# Patient Record
Sex: Male | Born: 1935
Health system: Southern US, Community
[De-identification: ages and names within clinical notes are randomized; demographics above are authoritative.]

## PROBLEM LIST (undated history)

## (undated) DIAGNOSIS — I714 Abdominal aortic aneurysm, without rupture, unspecified: Secondary | ICD-10-CM

## (undated) DIAGNOSIS — K219 Gastro-esophageal reflux disease without esophagitis: Secondary | ICD-10-CM

## (undated) DIAGNOSIS — I10 Essential (primary) hypertension: Secondary | ICD-10-CM

## (undated) DIAGNOSIS — R002 Palpitations: Secondary | ICD-10-CM

## (undated) DIAGNOSIS — N529 Male erectile dysfunction, unspecified: Secondary | ICD-10-CM

## (undated) DIAGNOSIS — E785 Hyperlipidemia, unspecified: Secondary | ICD-10-CM

## (undated) HISTORY — DX: Abdominal aortic aneurysm, without rupture: I71.4

## (undated) HISTORY — DX: Abdominal aortic aneurysm, without rupture, unspecified: I71.40

## (undated) HISTORY — DX: Hyperlipidemia, unspecified: E78.5

## (undated) HISTORY — PX: INGUINAL HERNIA REPAIR: SUR1180

## (undated) HISTORY — DX: Male erectile dysfunction, unspecified: N52.9

## (undated) HISTORY — DX: Essential (primary) hypertension: I10

## (undated) HISTORY — DX: Palpitations: R00.2

## (undated) HISTORY — PX: HEMORROIDECTOMY: SUR656

## (undated) HISTORY — DX: Gastro-esophageal reflux disease without esophagitis: K21.9

---

## 1967-12-14 HISTORY — PX: NEPHRECTOMY: SHX65

## 1967-12-14 HISTORY — PX: KIDNEY DONATION: SHX685

## 2011-12-30 DIAGNOSIS — H2510 Age-related nuclear cataract, unspecified eye: Secondary | ICD-10-CM | POA: Diagnosis not present

## 2011-12-30 DIAGNOSIS — H4011X0 Primary open-angle glaucoma, stage unspecified: Secondary | ICD-10-CM | POA: Diagnosis not present

## 2012-02-07 DIAGNOSIS — I10 Essential (primary) hypertension: Secondary | ICD-10-CM | POA: Diagnosis not present

## 2012-02-07 DIAGNOSIS — Z125 Encounter for screening for malignant neoplasm of prostate: Secondary | ICD-10-CM | POA: Diagnosis not present

## 2012-02-07 DIAGNOSIS — N529 Male erectile dysfunction, unspecified: Secondary | ICD-10-CM | POA: Diagnosis not present

## 2012-02-07 DIAGNOSIS — E7800 Pure hypercholesterolemia, unspecified: Secondary | ICD-10-CM | POA: Diagnosis not present

## 2012-02-07 DIAGNOSIS — K219 Gastro-esophageal reflux disease without esophagitis: Secondary | ICD-10-CM | POA: Diagnosis not present

## 2012-02-07 DIAGNOSIS — I714 Abdominal aortic aneurysm, without rupture: Secondary | ICD-10-CM | POA: Diagnosis not present

## 2012-02-07 DIAGNOSIS — Z79899 Other long term (current) drug therapy: Secondary | ICD-10-CM | POA: Diagnosis not present

## 2012-02-07 DIAGNOSIS — Z136 Encounter for screening for cardiovascular disorders: Secondary | ICD-10-CM | POA: Diagnosis not present

## 2012-02-11 DIAGNOSIS — R7309 Other abnormal glucose: Secondary | ICD-10-CM | POA: Diagnosis not present

## 2012-02-17 ENCOUNTER — Encounter: Payer: Self-pay | Admitting: Vascular Surgery

## 2012-02-18 ENCOUNTER — Ambulatory Visit (INDEPENDENT_AMBULATORY_CARE_PROVIDER_SITE_OTHER): Payer: Medicare Other | Admitting: Vascular Surgery

## 2012-02-18 ENCOUNTER — Encounter: Payer: Self-pay | Admitting: Vascular Surgery

## 2012-02-18 VITALS — BP 132/80 | HR 64 | Resp 18 | Ht 68.0 in | Wt 158.0 lb

## 2012-02-18 DIAGNOSIS — I714 Abdominal aortic aneurysm, without rupture, unspecified: Secondary | ICD-10-CM | POA: Insufficient documentation

## 2012-02-18 DIAGNOSIS — I724 Aneurysm of artery of lower extremity: Secondary | ICD-10-CM

## 2012-02-18 DIAGNOSIS — R0989 Other specified symptoms and signs involving the circulatory and respiratory systems: Secondary | ICD-10-CM

## 2012-02-18 NOTE — Progress Notes (Signed)
[facility] [place]  Referred by: Desmond Dike, MD [address] [place]. [place], [place] [address]  Reason for referral: AAA  History of Present Illness  The patient is a 77 y.o. (1935/07/18) male who presents with chief complaint: new AAA on screening.  Previous studies demonstrate an AAA, measuring 4.5 cm.  The patient does not have back or abdominal pain.  The patient does not history of embolic episodes from the AAA.  The patient's risk factors for AAA included: family (father had TAA), history of smoking, age, and male sex.  The patient does not smoke cigarettes anymore.  Past Medical History  Diagnosis Date  . Hyperlipidemia   . Hypertension   . GERD (gastroesophageal reflux disease)   . ED (erectile dysfunction)   . Palpitations   . AAA (abdominal aortic aneurysm)     Past Surgical History  Procedure Date  . Inguinal hernia repair     Right inguinal hernia  . Hemorroidectomy   . Nephrectomy 1969    Donor for his brother    History   Social History  . Marital Status: Unknown    Spouse Name: N/A    Number of Children: N/A  . Years of Education: N/A   Occupational History  . Not on file.   Social History Main Topics  . Smoking status: Former Smoker    Quit date: 02/17/1992  . Smokeless tobacco: Not on file  . Alcohol Use: Yes     Occasional use  . Drug Use: No  . Sexually Active:    Other Topics Concern  . Not on file   Social History Narrative  . No narrative on file    Family History  Problem Relation Age of Onset  . Cancer Brother     prostate cancer    Current Outpatient Prescriptions on File Prior to Visit  Medication Sig Dispense Refill  . amLODipine (NORVASC) 10 MG tablet Take 10 mg by mouth daily.      Marland Kitchen aspirin 81 MG tablet Take 81 mg by mouth daily.      . bisoprolol-hydrochlorothiazide (ZIAC) 5-6.25 MG per tablet Take 1 tablet by mouth daily.      . fish oil-omega-3 fatty acids 1000 MG capsule Take 1 g by mouth daily.        . ranitidine (ZANTAC) 150 MG tablet Take 150 mg by mouth 2 (two) times daily.      . simvastatin (ZOCOR) 20 MG tablet Take 20 mg by mouth at bedtime.      . vardenafil (LEVITRA) 20 MG tablet Take 20 mg by mouth daily as needed.      . vitamin E 400 UNIT capsule Take 400 Units by mouth daily.        No Known Allergies  REVIEW OF SYSTEMS:  (Positives checked otherwise negative)  CARDIOVASCULAR: [ ]  chest pain    [ ]  chest pressure    [ ]  palpitations   [ ]  orthopnea   [ ]  dyspnea on exert. [ ]  claudication    [ ]  rest pain     [ ]  DVT     [ ]  phlebitis  PULMONARY:    [ ]  productive cough [ ]  asthma  [ ]  wheezing  NEUROLOGIC:    [ ]  weakness    [ ]  paresthesias   [ ]  aphasia    [ ]  amaurosis    [ ]  dizziness  HEMATOLOGIC:    [ ]  bleeding problems  [ ]   clotting disorders  MUSCULOSKEL: [ ]  joint pain     [ ]  joint swelling  GASTROINTEST:  [ ]   blood in stool   [ ]   hematemesis  GENITOURINARY:   [ ]   dysuria    [ ]   hematuria  PSYCHIATRIC:   [ ]  history of major depression  INTEGUMENTARY: [ ]  rashes    [ ]  ulcers  CONSTITUTIONAL:  [ ]  fever     [ ]  chills  Physical Examination  Filed Vitals:   02/18/12 0858  BP: 132/80  Pulse: 64  Resp: 18  Height: 5\' 8"  (1.727 m)  Weight: 158 lb (71.668 kg)  SpO2: 99%   Body mass index is 24.02 kg/(m^2).  General: A&O x 3, WDWN  Head: [place]/AT  Ear/Nose/Throat: Hearing grossly intact, nares w/o erythema or drainage, oropharynx w/o Erythema/Exudate  Eyes: PERRLA, EOMI  Neck: Supple, no nuchal rigidity, no palpable LAD  Pulmonary: Sym exp, good air movt, CTAB, no rales, rhonchi, & wheezing  Cardiac: RRR, Nl S1, S2, no Murmurs, rubs or gallops  Vascular: Vessel Right Left  Radial Palpable Palpable  Brachial Palpable Palpable  Carotid Palpable, with bruit Palpable, without bruit  Aorta Non-palpable N/A  Femoral Palpable Palpable  Popliteal Prominently palpable Prominently palpable  PT Palpable Palpable  DP Palpable Palpable    Gastrointestinal: soft, NTND, -G/R, - HSM, - masses, - CVAT B  Musculoskeletal: M/S 5/5 throughout , Extremities without ischemic changes   Neurologic: CN 2-12 intact , Pain and light touch intact in extremities , Motor exam as listed above  Psychiatric: Judgment intact, Mood & affect appropriate for pt's clinical situation  Dermatologic: See M/S exam for extremity exam, no rashes otherwise noted  Lymph : No Cervical, Axillary, or Inguinal lymphadenopathy   Outside Studies/Documentation 5 pages of outside documents were reviewed including: outside ultrasound report.  Medical Decision Making  The patient is a 77 y.o. male who presents with: small asx AAA, possible B popliteal aneurysms, and R carotid bruit   Based on this patient's exam and diagnostic studies, he needs: CTA chest/abd/pelvis, Duplex B popliteal, and B carotid duplex.  The threshold for repair is AAA size > 5.5 cm, growth > 1 cm/yr, and symptomatic status.  The patient will follow up in 4 weeks with the above studies.  I emphasized the importance of maximal medical management including strict control of blood pressure, blood glucose, and lipid levels, antiplatelet agents, obtaining regular exercise, and cessation of smoking.    Thank you for allowing [place] to participate in this patient's care.  Leonides Sake, MD [facility] Office: [phone] Pager: [phone]  02/18/2012, 9:19 AM

## 2012-03-23 ENCOUNTER — Encounter: Payer: Self-pay | Admitting: Vascular Surgery

## 2012-03-23 DIAGNOSIS — I714 Abdominal aortic aneurysm, without rupture: Secondary | ICD-10-CM | POA: Diagnosis not present

## 2012-03-24 ENCOUNTER — Ambulatory Visit (INDEPENDENT_AMBULATORY_CARE_PROVIDER_SITE_OTHER): Payer: Medicare Other | Admitting: Vascular Surgery

## 2012-03-24 ENCOUNTER — Ambulatory Visit
Admission: RE | Admit: 2012-03-24 | Discharge: 2012-03-24 | Disposition: A | Discharge status: Home / Self Care | Payer: Medicare Other | Source: Ambulatory Visit | Attending: Vascular Surgery | Admitting: Vascular Surgery

## 2012-03-24 ENCOUNTER — Encounter (INDEPENDENT_AMBULATORY_CARE_PROVIDER_SITE_OTHER): Payer: Medicare Other | Admitting: *Deleted

## 2012-03-24 ENCOUNTER — Ambulatory Visit: Payer: Medicare Other | Admitting: Vascular Surgery

## 2012-03-24 ENCOUNTER — Encounter: Payer: Self-pay | Admitting: Vascular Surgery

## 2012-03-24 ENCOUNTER — Other Ambulatory Visit (INDEPENDENT_AMBULATORY_CARE_PROVIDER_SITE_OTHER): Payer: Medicare Other | Admitting: *Deleted

## 2012-03-24 ENCOUNTER — Other Ambulatory Visit: Payer: Medicare Other

## 2012-03-24 VITALS — BP 144/69 | HR 57 | Temp 97.9°F | Ht 68.0 in | Wt 158.0 lb

## 2012-03-24 DIAGNOSIS — I70209 Unspecified atherosclerosis of native arteries of extremities, unspecified extremity: Secondary | ICD-10-CM | POA: Diagnosis not present

## 2012-03-24 DIAGNOSIS — I714 Abdominal aortic aneurysm, without rupture, unspecified: Secondary | ICD-10-CM

## 2012-03-24 DIAGNOSIS — K5730 Diverticulosis of large intestine without perforation or abscess without bleeding: Secondary | ICD-10-CM | POA: Diagnosis not present

## 2012-03-24 DIAGNOSIS — R0989 Other specified symptoms and signs involving the circulatory and respiratory systems: Secondary | ICD-10-CM

## 2012-03-24 DIAGNOSIS — I6529 Occlusion and stenosis of unspecified carotid artery: Secondary | ICD-10-CM | POA: Diagnosis not present

## 2012-03-24 DIAGNOSIS — I724 Aneurysm of artery of lower extremity: Secondary | ICD-10-CM

## 2012-03-24 MED ORDER — IOHEXOL 350 MG/ML SOLN
80.0000 mL | Freq: Once | INTRAVENOUS | Status: AC | PRN
  Administered 2012-03-24: 80 mL via INTRAVENOUS

## 2012-03-24 NOTE — Procedures (Unsigned)
LOWER EXTREMITY ARTERIAL DUPLEX  INDICATION:  4.5 abdominal aortic aneurysm  HISTORY: Diabetes:  No Cardiac:  No Hypertension:  Yes Smoking:  Previous Previous Surgery:  Left nephrectomy, donation to brother  SINGLE LEVEL ARTERIAL EXAM                         RIGHT                LEFT Brachial: Anterior tibial: Posterior tibial: Peroneal: Ankle/Brachial Index:   1.10                 1.16  LOWER EXTREMITY ARTERIAL DUPLEX EXAM  DUPLEX:  Mild heterogeneous plaque throughout both lower extremities with biphasic waveforms.  IMPRESSION: 1. No evidence of significant lower extremity arterial disease. 2. No popliteal aneurysm visualized. 3. Ankle brachial indices within normal range, see attached sheet.  ___________________________________________ Fransisco Hertz, MD  SS/MEDQ  D:  03/24/2012  T:  03/24/2012  Job:  [id number]

## 2012-03-24 NOTE — Progress Notes (Signed)
[facility]  Established Abdominal Aortic Aneurysm  History of Present Illness  The patient is a 77 y.o. (08/13/1935) male who presents with chief complaint: follow up for AAA.  The pt returns for a multitude of studies to fully evaluate his AAA and arterial system.  The patient does not have back or abdominal pain.  The patient is a former smoker who quit 20 years ago  The patient's PMH, PSH, SH, FamHx, Med, Allergies and ROS are unchanged from 02/18/12.  Physical Examination  Filed Vitals:   03/24/12 1418  BP: 144/69  Pulse: 57  Temp: 97.9 F (36.6 C)  TempSrc: Oral  Height: 5\' 8"  (1.727 m)  Weight: 158 lb (71.668 kg)   Body mass index is 24.02 kg/(m^2).  General: A&O x 3, WDWN  Pulmonary: Sym exp, good air movt, CTAB, no rales, rhonchi, & wheezing  Cardiac: RRR, Nl S1, S2, no Murmurs, rubs or gallops  Vascular: Vessel Right Left  Radial Palpable Palpable  Brachial Palpable Palpable  Carotid Palpable, without bruit Palpable, without bruit  Aorta Non-palpable N/A  Femoral Palpable Palpable  Popliteal Non-palpable Non-palpable  PT Palpable Palpable  DP Palpable Palpable   Gastrointestinal: soft, NTND, -G/R, - HSM, - masses, - CVAT B  Musculoskeletal: M/S 5/5 throughout , Extremities without ischemic changes   Neurologic: Pain and light touch intact in extremities , Motor exam as listed above  CTA Chest/Abd/Pelvis (Date: 03/24/12) 1. Negative for thoracic aortic aneurysm.  2. Moderate centrilobular emphysematous change. 3. Fusiform infrarenal abdominal aortic aneurysm measures 4.4 x 4.2 cm in greatest true luminal diameter. The aneurysm originates approximately 3.4 cm caudal to the solitary remaining right-sided renal artery and extends to the level of the aortic bifurcation.  The aneurysm is largely free of mural thrombus. The IMA remains  patent.  4. At least 50% luminal narrowing of the origin of the right common iliac artery. The left  common iliac arteries noted be tortuous but free of hemodynamically significant narrowing.  5. Colonic diverticulosis without evidence of diverticulitis.  6. Post left-sided nephrectomy compatible with provided history of renal donor.  7. Geographic areas of hyperenhancement within the dome of the right lobe of the liver are favored to be perfusional in etiology. Continued attention on follow-up is recommended.  Based on my interpretation of this patient's CTA Chest/abd/pelvis: he has no TAA, he has an AAA that is roughly 4.2 cm in diameter.  There is remarkably limited thrombus in this aneurysm.  His anatomy appears compatible with an EVAR.  Non-Invasive Vascular Imaging  BLE ABI (Date: 03/24/12)  RLE: 1.1, PT and DP: triphasic  LLE: 1.16, PT and DP: triphasic  CAROTID DUPLEX (Date: 03/24/12):   R ICA stenosis: 1-39%  R VA: patent and antegrade  L ICA stenosis: 1-39%  L VA: patent and antegrade  BLE Arterial Duplex (Date: 03/24/12)  Widely patent BLE arterial system with no evidence of popliteal aneurysm  Medical Decision Making  The patient is a 77 y.o. male who presents with: small AAA.   The pt has no evidence of B ICA stenosis despite bruit in R neck  Additionally he has no evidence of popliteal aneurysm despite prominent pulses  Based on this patient's exam and diagnostic studies, he needs continued annual surveillance with AAA duplex.  The threshold for repair is AAA size > 5.5 cm, growth > 1 cm/yr, and symptomatic status.  I emphasized the importance of maximal medical management including strict control of blood pressure, blood  glucose, and lipid levels, antiplatelet agents, obtaining regular exercise, and cessation of smoking.    Thank you for allowing [place] to participate in this patient's care.  Leonides Sake, MD [facility] Office: [phone] Pager: [phone]  03/24/2012, 5:01 PM

## 2012-03-27 NOTE — Procedures (Unsigned)
CAROTID DUPLEX EXAM  INDICATION:  Right carotid bruit, carotid artery disease  HISTORY: Diabetes:  No Cardiac:  No Hypertension:  Yes Smoking:  Previous Previous Surgery: CV History:  Asymptomatic Amaurosis Fugax No, Paresthesias No, Hemiparesis No                                      RIGHT             LEFT Brachial systolic pressure:         142               141 Brachial Doppler waveforms:         Triphasic         Triphasic Vertebral direction of flow:        Antegrade         Antegrade DUPLEX VELOCITIES (cm/sec) CCA peak systolic                   97                100 ECA peak systolic                   140               75 ICA peak systolic                   91 (distal)       70 ICA end diastolic                   28                24 PLAQUE MORPHOLOGY:                  Mixed             Mixed PLAQUE AMOUNT:                      Small to moderate, irregular        Small, irregular PLAQUE LOCATION:                    Bifurcation, ICA and ECA            Bifurcation, ICA  IMPRESSION:  1%-39% internal carotid artery stenosis bilaterally. Vertebral artery flow antegrade bilaterally.  ___________________________________________ Fransisco Hertz, MD  SS/MEDQ  D:  03/24/2012  T:  03/24/2012  Job:  [id number]

## 2012-05-04 DIAGNOSIS — H4011X0 Primary open-angle glaucoma, stage unspecified: Secondary | ICD-10-CM | POA: Diagnosis not present

## 2012-05-04 DIAGNOSIS — H2510 Age-related nuclear cataract, unspecified eye: Secondary | ICD-10-CM | POA: Diagnosis not present

## 2012-05-19 DIAGNOSIS — S43499A Other sprain of unspecified shoulder joint, initial encounter: Secondary | ICD-10-CM | POA: Diagnosis not present

## 2012-05-19 DIAGNOSIS — M25519 Pain in unspecified shoulder: Secondary | ICD-10-CM | POA: Diagnosis not present

## 2012-05-19 DIAGNOSIS — S46819A Strain of other muscles, fascia and tendons at shoulder and upper arm level, unspecified arm, initial encounter: Secondary | ICD-10-CM | POA: Diagnosis not present

## 2012-05-24 DIAGNOSIS — M25519 Pain in unspecified shoulder: Secondary | ICD-10-CM | POA: Diagnosis not present

## 2012-05-24 DIAGNOSIS — S43499A Other sprain of unspecified shoulder joint, initial encounter: Secondary | ICD-10-CM | POA: Diagnosis not present

## 2012-05-24 DIAGNOSIS — M6281 Muscle weakness (generalized): Secondary | ICD-10-CM | POA: Diagnosis not present

## 2012-05-29 DIAGNOSIS — M6281 Muscle weakness (generalized): Secondary | ICD-10-CM | POA: Diagnosis not present

## 2012-05-29 DIAGNOSIS — M25519 Pain in unspecified shoulder: Secondary | ICD-10-CM | POA: Diagnosis not present

## 2012-05-29 DIAGNOSIS — S43499A Other sprain of unspecified shoulder joint, initial encounter: Secondary | ICD-10-CM | POA: Diagnosis not present

## 2012-05-29 DIAGNOSIS — S46819A Strain of other muscles, fascia and tendons at shoulder and upper arm level, unspecified arm, initial encounter: Secondary | ICD-10-CM | POA: Diagnosis not present

## 2012-06-01 DIAGNOSIS — S46819A Strain of other muscles, fascia and tendons at shoulder and upper arm level, unspecified arm, initial encounter: Secondary | ICD-10-CM | POA: Diagnosis not present

## 2012-06-01 DIAGNOSIS — M25519 Pain in unspecified shoulder: Secondary | ICD-10-CM | POA: Diagnosis not present

## 2012-06-01 DIAGNOSIS — M6281 Muscle weakness (generalized): Secondary | ICD-10-CM | POA: Diagnosis not present

## 2012-06-05 DIAGNOSIS — M6281 Muscle weakness (generalized): Secondary | ICD-10-CM | POA: Diagnosis not present

## 2012-06-05 DIAGNOSIS — S43499A Other sprain of unspecified shoulder joint, initial encounter: Secondary | ICD-10-CM | POA: Diagnosis not present

## 2012-06-05 DIAGNOSIS — M25519 Pain in unspecified shoulder: Secondary | ICD-10-CM | POA: Diagnosis not present

## 2012-06-07 DIAGNOSIS — M25519 Pain in unspecified shoulder: Secondary | ICD-10-CM | POA: Diagnosis not present

## 2012-06-07 DIAGNOSIS — S46819A Strain of other muscles, fascia and tendons at shoulder and upper arm level, unspecified arm, initial encounter: Secondary | ICD-10-CM | POA: Diagnosis not present

## 2012-06-07 DIAGNOSIS — M6281 Muscle weakness (generalized): Secondary | ICD-10-CM | POA: Diagnosis not present

## 2012-06-07 DIAGNOSIS — S43499A Other sprain of unspecified shoulder joint, initial encounter: Secondary | ICD-10-CM | POA: Diagnosis not present

## 2012-06-12 DIAGNOSIS — M6281 Muscle weakness (generalized): Secondary | ICD-10-CM | POA: Diagnosis not present

## 2012-06-12 DIAGNOSIS — M25519 Pain in unspecified shoulder: Secondary | ICD-10-CM | POA: Diagnosis not present

## 2012-06-12 DIAGNOSIS — S46819A Strain of other muscles, fascia and tendons at shoulder and upper arm level, unspecified arm, initial encounter: Secondary | ICD-10-CM | POA: Diagnosis not present

## 2012-06-12 DIAGNOSIS — S43499A Other sprain of unspecified shoulder joint, initial encounter: Secondary | ICD-10-CM | POA: Diagnosis not present

## 2012-06-14 DIAGNOSIS — M6281 Muscle weakness (generalized): Secondary | ICD-10-CM | POA: Diagnosis not present

## 2012-06-14 DIAGNOSIS — S43499A Other sprain of unspecified shoulder joint, initial encounter: Secondary | ICD-10-CM | POA: Diagnosis not present

## 2012-06-14 DIAGNOSIS — M25519 Pain in unspecified shoulder: Secondary | ICD-10-CM | POA: Diagnosis not present

## 2012-06-19 DIAGNOSIS — S43499A Other sprain of unspecified shoulder joint, initial encounter: Secondary | ICD-10-CM | POA: Diagnosis not present

## 2012-06-19 DIAGNOSIS — M6281 Muscle weakness (generalized): Secondary | ICD-10-CM | POA: Diagnosis not present

## 2012-06-19 DIAGNOSIS — M25519 Pain in unspecified shoulder: Secondary | ICD-10-CM | POA: Diagnosis not present

## 2012-06-26 DIAGNOSIS — S46819A Strain of other muscles, fascia and tendons at shoulder and upper arm level, unspecified arm, initial encounter: Secondary | ICD-10-CM | POA: Diagnosis not present

## 2012-06-26 DIAGNOSIS — M6281 Muscle weakness (generalized): Secondary | ICD-10-CM | POA: Diagnosis not present

## 2012-06-26 DIAGNOSIS — M25519 Pain in unspecified shoulder: Secondary | ICD-10-CM | POA: Diagnosis not present

## 2012-06-28 DIAGNOSIS — S46819A Strain of other muscles, fascia and tendons at shoulder and upper arm level, unspecified arm, initial encounter: Secondary | ICD-10-CM | POA: Diagnosis not present

## 2012-06-28 DIAGNOSIS — M25519 Pain in unspecified shoulder: Secondary | ICD-10-CM | POA: Diagnosis not present

## 2012-06-28 DIAGNOSIS — M6281 Muscle weakness (generalized): Secondary | ICD-10-CM | POA: Diagnosis not present

## 2012-07-03 DIAGNOSIS — M25519 Pain in unspecified shoulder: Secondary | ICD-10-CM | POA: Diagnosis not present

## 2012-07-03 DIAGNOSIS — S46819A Strain of other muscles, fascia and tendons at shoulder and upper arm level, unspecified arm, initial encounter: Secondary | ICD-10-CM | POA: Diagnosis not present

## 2012-07-03 DIAGNOSIS — S43499A Other sprain of unspecified shoulder joint, initial encounter: Secondary | ICD-10-CM | POA: Diagnosis not present

## 2012-07-03 DIAGNOSIS — M6281 Muscle weakness (generalized): Secondary | ICD-10-CM | POA: Diagnosis not present

## 2012-07-07 DIAGNOSIS — M25519 Pain in unspecified shoulder: Secondary | ICD-10-CM | POA: Diagnosis not present

## 2012-07-10 DIAGNOSIS — M6281 Muscle weakness (generalized): Secondary | ICD-10-CM | POA: Diagnosis not present

## 2012-07-10 DIAGNOSIS — S43499A Other sprain of unspecified shoulder joint, initial encounter: Secondary | ICD-10-CM | POA: Diagnosis not present

## 2012-07-10 DIAGNOSIS — M25519 Pain in unspecified shoulder: Secondary | ICD-10-CM | POA: Diagnosis not present

## 2012-07-17 DIAGNOSIS — S43499A Other sprain of unspecified shoulder joint, initial encounter: Secondary | ICD-10-CM | POA: Diagnosis not present

## 2012-07-17 DIAGNOSIS — S46819A Strain of other muscles, fascia and tendons at shoulder and upper arm level, unspecified arm, initial encounter: Secondary | ICD-10-CM | POA: Diagnosis not present

## 2012-07-17 DIAGNOSIS — M25519 Pain in unspecified shoulder: Secondary | ICD-10-CM | POA: Diagnosis not present

## 2012-07-17 DIAGNOSIS — M6281 Muscle weakness (generalized): Secondary | ICD-10-CM | POA: Diagnosis not present

## 2012-07-19 DIAGNOSIS — S46819A Strain of other muscles, fascia and tendons at shoulder and upper arm level, unspecified arm, initial encounter: Secondary | ICD-10-CM | POA: Diagnosis not present

## 2012-07-19 DIAGNOSIS — M25519 Pain in unspecified shoulder: Secondary | ICD-10-CM | POA: Diagnosis not present

## 2012-07-19 DIAGNOSIS — M6281 Muscle weakness (generalized): Secondary | ICD-10-CM | POA: Diagnosis not present

## 2012-07-24 DIAGNOSIS — M6281 Muscle weakness (generalized): Secondary | ICD-10-CM | POA: Diagnosis not present

## 2012-07-24 DIAGNOSIS — M25519 Pain in unspecified shoulder: Secondary | ICD-10-CM | POA: Diagnosis not present

## 2012-07-24 DIAGNOSIS — S43499A Other sprain of unspecified shoulder joint, initial encounter: Secondary | ICD-10-CM | POA: Diagnosis not present

## 2012-07-24 DIAGNOSIS — S46819A Strain of other muscles, fascia and tendons at shoulder and upper arm level, unspecified arm, initial encounter: Secondary | ICD-10-CM | POA: Diagnosis not present

## 2012-08-23 DIAGNOSIS — Z79899 Other long term (current) drug therapy: Secondary | ICD-10-CM | POA: Diagnosis not present

## 2012-08-23 DIAGNOSIS — E7800 Pure hypercholesterolemia, unspecified: Secondary | ICD-10-CM | POA: Diagnosis not present

## 2012-08-23 DIAGNOSIS — Z23 Encounter for immunization: Secondary | ICD-10-CM | POA: Diagnosis not present

## 2012-09-28 ENCOUNTER — Encounter: Payer: Self-pay | Admitting: Neurosurgery

## 2012-09-28 DIAGNOSIS — J069 Acute upper respiratory infection, unspecified: Secondary | ICD-10-CM | POA: Diagnosis not present

## 2012-09-29 ENCOUNTER — Ambulatory Visit (INDEPENDENT_AMBULATORY_CARE_PROVIDER_SITE_OTHER): Payer: Medicare Other | Admitting: Neurosurgery

## 2012-09-29 ENCOUNTER — Encounter: Payer: Self-pay | Admitting: Neurosurgery

## 2012-09-29 ENCOUNTER — Encounter (INDEPENDENT_AMBULATORY_CARE_PROVIDER_SITE_OTHER): Payer: Medicare Other | Admitting: *Deleted

## 2012-09-29 ENCOUNTER — Ambulatory Visit: Payer: Medicare Other | Admitting: Vascular Surgery

## 2012-09-29 VITALS — BP 130/68 | HR 63 | Resp 16 | Ht 68.0 in | Wt 152.5 lb

## 2012-09-29 DIAGNOSIS — I714 Abdominal aortic aneurysm, without rupture: Secondary | ICD-10-CM

## 2012-09-29 NOTE — Progress Notes (Signed)
[facility] AAA/PAD/PVD Office Note  CC: AAA surveillance Referring Physician: Imogene Burn  History of Present Illness:  77 year old male patient of Dr. Imogene Burn followed for small known AAA. The patient denies any unusual back or abdominal pain. Dr. Imogene Burn evaluated the patient thoroughly for any PVD in April 2013.   Past Medical History  Diagnosis Date  . Hyperlipidemia   . Hypertension   . GERD (gastroesophageal reflux disease)   . ED (erectile dysfunction)   . Palpitations   . AAA (abdominal aortic aneurysm)     ROS: [x]  Positive   [ ]  Denies    General: [ ]  Weight loss, [ ]  Fever, [ ]  chills Neurologic: [ ]  Dizziness, [ ]  Blackouts, [ ]  Seizure [ ]  Stroke, [ ]  "Mini stroke", [ ]  Slurred speech, [ ]  Temporary blindness; [ ]  weakness in arms or legs, [ ]  Hoarseness Cardiac: [ ]  Chest pain/pressure, [ ]  Shortness of breath at rest [ ]  Shortness of breath with exertion, [ ]  Atrial fibrillation or irregular heartbeat Vascular: [ ]  Pain in legs with walking, [ ]  Pain in legs at rest, [ ]  Pain in legs at night,  [ ]  Non-healing ulcer, [ ]  Blood clot in vein/DVT,   Pulmonary: [ ]  Home oxygen, [ ]  Productive cough, [ ]  Coughing up blood, [ ]  Asthma,  [ ]  Wheezing Musculoskeletal:  [ ]  Arthritis, [ ]  Low back pain, [ ]  Joint pain Hematologic: [ ]  Easy Bruising, [ ]  Anemia; [ ]  Hepatitis Gastrointestinal: [ ]  Blood in stool, [ ]  Gastroesophageal Reflux/heartburn, [ ]  Trouble swallowing Urinary: [ ]  chronic Kidney disease, [ ]  on HD - [ ]  MWF or [ ]  TTHS, [ ]  Burning with urination, [ ]  Difficulty urinating Skin: [ ]  Rashes, [ ]  Wounds Psychological: [ ]  Anxiety, [ ]  Depression   Social History History  Substance Use Topics  . Smoking status: Former Smoker    Quit date: 02/17/1992  . Smokeless tobacco: Not on file  . Alcohol Use: Yes     Occasional use    Family History Family History  Problem Relation Age of Onset  . Cancer Brother     prostate cancer  .  Anuerysm Father     abdominal aortic    No Known Allergies  Current Outpatient Prescriptions  Medication Sig Dispense Refill  . amLODipine (NORVASC) 10 MG tablet Take 10 mg by mouth daily.      Marland Kitchen aspirin 81 MG tablet Take 81 mg by mouth daily.      . bisoprolol-hydrochlorothiazide (ZIAC) 5-6.25 MG per tablet Take 1 tablet by mouth daily.      . fish oil-omega-3 fatty acids 1000 MG capsule Take 1 g by mouth daily.      . ranitidine (ZANTAC) 150 MG tablet Take 150 mg by mouth 2 (two) times daily.      . simvastatin (ZOCOR) 20 MG tablet Take 20 mg by mouth at bedtime.      . TRAVATAN Z 0.004 % SOLN ophthalmic solution At bedtime.      . vardenafil (LEVITRA) 20 MG tablet Take 20 mg by mouth daily as needed.      . vitamin E 400 UNIT capsule Take 400 Units by mouth daily.        Physical Examination  Filed Vitals:   09/29/12 0949  BP: 130/68  Pulse: 63  Resp: 16    Body mass index is 23.19 kg/(m^2).  General:  WDWN in NAD Gait:  Normal HEENT: WNL Eyes: Pupils equal Pulmonary: normal non-labored breathing , without Rales, rhonchi,  wheezing Cardiac: RRR, without  Murmurs, rubs or gallops; No carotid bruits Abdomen: soft, NT, no masses Skin: no rashes, ulcers noted Vascular Exam/Pulses: Palpable lower extremity pulses bilaterally, no abdominal mass is palpated  Extremities without ischemic changes, no Gangrene , no cellulitis; no open wounds;  Musculoskeletal: no muscle wasting or atrophy  Neurologic: A&O X 3; Appropriate Affect ; SENSATION: normal; MOTOR FUNCTION:  moving all extremities equally. Speech is fluent/normal  Non-Invasive Vascular Imaging: AAA duplex today shows a maximum diameter of 4.0 x 4.2 which is diminished from 4.4 in April of 2013  ASSESSMENT/PLAN: Asymptomatic patient with known AAA. The patient will followup in one year with repeat duplex. The patient's questions were encouraged and answered, he is in agreement with this plan.  Lauree Chandler  ANP  Clinic M.D.: Imogene Burn

## 2012-09-29 NOTE — Addendum Note (Signed)
Addended by: Melodye Ped C on: 09/29/2012 04:10 PM   Modules accepted: Orders

## 2012-12-18 DIAGNOSIS — D485 Neoplasm of uncertain behavior of skin: Secondary | ICD-10-CM | POA: Diagnosis not present

## 2012-12-18 DIAGNOSIS — L821 Other seborrheic keratosis: Secondary | ICD-10-CM | POA: Diagnosis not present

## 2012-12-18 DIAGNOSIS — L570 Actinic keratosis: Secondary | ICD-10-CM | POA: Diagnosis not present

## 2012-12-20 DIAGNOSIS — S43499A Other sprain of unspecified shoulder joint, initial encounter: Secondary | ICD-10-CM | POA: Diagnosis not present

## 2012-12-20 DIAGNOSIS — S46819A Strain of other muscles, fascia and tendons at shoulder and upper arm level, unspecified arm, initial encounter: Secondary | ICD-10-CM | POA: Diagnosis not present

## 2012-12-27 DIAGNOSIS — S43499A Other sprain of unspecified shoulder joint, initial encounter: Secondary | ICD-10-CM | POA: Diagnosis not present

## 2013-01-03 DIAGNOSIS — S43429A Sprain of unspecified rotator cuff capsule, initial encounter: Secondary | ICD-10-CM | POA: Diagnosis not present

## 2013-01-11 DIAGNOSIS — R42 Dizziness and giddiness: Secondary | ICD-10-CM | POA: Diagnosis not present

## 2013-01-11 DIAGNOSIS — K4090 Unilateral inguinal hernia, without obstruction or gangrene, not specified as recurrent: Secondary | ICD-10-CM | POA: Diagnosis not present

## 2013-01-11 DIAGNOSIS — I10 Essential (primary) hypertension: Secondary | ICD-10-CM | POA: Diagnosis not present

## 2013-01-18 DIAGNOSIS — H5200 Hypermetropia, unspecified eye: Secondary | ICD-10-CM | POA: Diagnosis not present

## 2013-01-18 DIAGNOSIS — H4011X0 Primary open-angle glaucoma, stage unspecified: Secondary | ICD-10-CM | POA: Diagnosis not present

## 2013-01-18 DIAGNOSIS — H2510 Age-related nuclear cataract, unspecified eye: Secondary | ICD-10-CM | POA: Diagnosis not present

## 2013-01-29 DIAGNOSIS — I10 Essential (primary) hypertension: Secondary | ICD-10-CM | POA: Diagnosis not present

## 2013-03-13 DIAGNOSIS — Z125 Encounter for screening for malignant neoplasm of prostate: Secondary | ICD-10-CM | POA: Diagnosis not present

## 2013-03-13 DIAGNOSIS — I10 Essential (primary) hypertension: Secondary | ICD-10-CM | POA: Diagnosis not present

## 2013-03-13 DIAGNOSIS — E7800 Pure hypercholesterolemia, unspecified: Secondary | ICD-10-CM | POA: Diagnosis not present

## 2013-03-26 DIAGNOSIS — R7301 Impaired fasting glucose: Secondary | ICD-10-CM | POA: Diagnosis not present

## 2013-06-21 DIAGNOSIS — H4011X0 Primary open-angle glaucoma, stage unspecified: Secondary | ICD-10-CM | POA: Diagnosis not present

## 2013-09-24 ENCOUNTER — Other Ambulatory Visit: Payer: Self-pay | Admitting: Vascular Surgery

## 2013-09-24 DIAGNOSIS — I714 Abdominal aortic aneurysm, without rupture: Secondary | ICD-10-CM

## 2013-10-04 ENCOUNTER — Encounter: Payer: Self-pay | Admitting: Family

## 2013-10-05 ENCOUNTER — Ambulatory Visit (INDEPENDENT_AMBULATORY_CARE_PROVIDER_SITE_OTHER): Payer: Medicare Other | Admitting: Family

## 2013-10-05 ENCOUNTER — Encounter (INDEPENDENT_AMBULATORY_CARE_PROVIDER_SITE_OTHER): Payer: Self-pay

## 2013-10-05 ENCOUNTER — Ambulatory Visit (HOSPITAL_COMMUNITY)
Admission: RE | Admit: 2013-10-05 | Discharge: 2013-10-05 | Disposition: A | Discharge status: Home / Self Care | Payer: Medicare Other | Source: Ambulatory Visit | Attending: Family | Admitting: Family

## 2013-10-05 ENCOUNTER — Encounter: Payer: Self-pay | Admitting: Family

## 2013-10-05 VITALS — BP 144/72 | HR 75 | Resp 16 | Ht 68.0 in | Wt 145.0 lb

## 2013-10-05 DIAGNOSIS — I714 Abdominal aortic aneurysm, without rupture, unspecified: Secondary | ICD-10-CM | POA: Insufficient documentation

## 2013-10-05 NOTE — Patient Instructions (Signed)
Abdominal Aortic Aneurysm  An aneurysm is the enlargement (dilatation), bulging, or ballooning out of part of the wall of a vein or artery. An aortic aneurysm is a bulging in the largest artery of the body. This artery supplies blood from the heart to the rest of the body.  The first part of the aorta is called the thoracic aorta. It leaves the heart, rises (ascends), arches, and goes down (descends) through the chest until it reaches the diaphragm. The diaphragm is the muscular part between the chest and abdomen.  The second part of the aorta is called the abdominal aorta after it has passed the diaphragm and continues down through the abdomen. The abdominal aorta ends where it splits to form the two iliac arteries that go to the legs. Aortic aneurysms can develop anywhere along the length of the aorta. The majority are located along the abdominal aorta. The major concern with an aortic aneurysm is that it can enlarge and rupture. This can cause death unless diagnosed and treated promptly. Aneurysms can also develop blood clots or infections. CAUSES  Many aortic aneurysms are caused by arteriosclerosis. Arteriosclerosis can weaken the aortic wall. The pressure of the blood being pumped through the aorta causes it to balloon out at the site of weakness. Therefore, high blood pressure (hypertension) is associated with aneurysm. Other risk factors include:  Age over 60.  Tobacco use.  Being male.  White race.  Family history of aneurysm.  Less frequent causes of abdominal aortic aneurysms include:  Connective tissue diseases.  Abdominal trauma.  Inflammation of blood vessles (arteritis).  Inherited (congenital) malformations.  Infection. SYMPTOMS  The signs and symptoms of an unruptured aneurysm will partly depend on its size and rate of growth.   Abdominal aortic aneurysms may cause pain. The pain typically has a deep quality as if it is piercing into the person. It is felt most  often in the lower back area. The pain is usually steady but may be relieved by changing your body position.  The person may also become aware of an abnormally prominent pulse in the belly (abdominal pulsation). DIAGNOSIS  An aortic aneurysm may be discovered by chance on physical exam, or on X-ray studies done for other reasons. It may be suspected because of other problems such as back or abdominal pain. The following tests may help identify the problem.  X-rays of the abdomen can show calcium deposits in the aneurysm wall.  CT scanning of the abdomen, particularly with contrast medium, is accurate at showing the exact size and shape of the aneurysm.  Ultrasounds give a clear picture of the size of an aneurysm (about 98% accuracy).  MRI scanning is accurate, but often unnecessary.  An abdominal angiogram shows the source of the major blood vessels arising from the aorta. It reveals the size and extent of any aneurysm. It can also show a clot clinging to the wall of the aneurysm (mural thrombus). TREATMENT  Treating an abdominal aortic aneurysm depends on the size. A rupture of an aneurysm is uncommon when they are less than 5 cm wide (2 inches). Rupture is far more common in aneurysms that are over 6 cm wide (2.4 inches).  Surgical repair is usually recommended for all aneurysms over 6 cm wide (2.4 inches). This depends on the health, age, and other circumstances of the individual. This type of surgery consists of opening the abdomen, removing the aneurysm, and sewing a synthetic graft (similar to a cloth tube) in its place. A   less invasive form of this surgery, using stent grafts, is sometimes recommended.  For most patients, elective repair is recommended for aneurysms between 4 and 6 cm (1.6 and 2.4 inches). Elective means the surgery can be done at your convenience. This should not be put off too long if surgery is recommended.  If you smoke, stop immediately. Smoking is a major risk  factor for enlargement and rupture.  Medications may be used to help decrease complications  these include medicine to lower blood pressure and control cholesterol. HOME CARE INSTRUCTIONS   If you smoke, stop. Do not start smoking.  Take all medications as prescribed.  Your caregiver will tell you when to have your aneurysm rechecked, either by ultrasound or CT scan.  If your caregiver has given you a follow-up appointment, it is very important to keep that appointment. Not keeping the appointment could result in a chronic or permanent injury, pain, or disability. If there is any problem keeping the appointment, you must call back to this facility for assistance. SEEK MEDICAL CARE IF:   You develop mild abdominal pain or pressure.  You are able to feel or perceive your aneurysm, and you sense any change. SEEK IMMEDIATE MEDICAL CARE IF:   You develop severe abdominal pain, or severe pain moving (radiating) to your back.  You suddenly develop cold or blue toes or feet.  You suddenly develop lightheadedness or fainting spells. MAKE SURE YOU:   Understand these instructions.  Will watch your condition.  Will get help right away if you are not doing well or get worse. Document Released: 09/08/2005 Document Revised: 02/21/2012 Document Reviewed: 07/02/2008 [employer] Patient Information 2014 [employer].  

## 2013-10-05 NOTE — Progress Notes (Signed)
[facility] [place]  Established Abdominal Aortic Aneurysm  History of Present Illness  Steven Mccormick is a 78 y.o. (Mar 26, 1935) male patient of Dr. Imogene Burn who presents with chief complaint: follow up for AAA.  Previous studies, October, 2013, demonstrate an AAA, measuring 4.55 cm.  The patient does denies have back or abdominal pain.  The patient is not a smoker. The patient denies claudication in legs with walking. The patient denies history of stroke or TIA symptoms. He states his systolic pressure is usually about 120.  Pt Diabetic: No  Past Medical History  Diagnosis Date  . Hyperlipidemia   . Hypertension   . GERD (gastroesophageal reflux disease)   . ED (erectile dysfunction)   . Palpitations   . AAA (abdominal aortic aneurysm)    Past Surgical History  Procedure Laterality Date  . Inguinal hernia repair      Right inguinal hernia  . Hemorroidectomy    . Nephrectomy  1969    Donor for his brother  . Kidney donation  23   Social History History   Social History  . Marital Status: Unknown    Spouse Name: N/A    Number of Children: N/A  . Years of Education: N/A   Occupational History  . Not on file.   Social History Main Topics  . Smoking status: Former Smoker    Quit date: 02/17/1992  . Smokeless tobacco: Not on file  . Alcohol Use: Yes     Comment: Occasional use  . Drug Use: No  . Sexual Activity:    Other Topics Concern  . Not on file   Social History Narrative  . No narrative on file   Family History Family History  Problem Relation Age of Onset  . Cancer Brother     prostate cancer  . Anuerysm Father     abdominal aortic    Current Outpatient Prescriptions on File Prior to Visit  Medication Sig Dispense Refill  . amLODipine (NORVASC) 10 MG tablet Take 10 mg by mouth daily.      Marland Kitchen aspirin 81 MG tablet Take 81 mg by mouth daily.      . bisoprolol-hydrochlorothiazide (ZIAC) 5-6.25 MG per tablet Take 1 tablet by mouth  daily.      . fish oil-omega-3 fatty acids 1000 MG capsule Take 1 g by mouth daily.      . ranitidine (ZANTAC) 150 MG tablet Take 150 mg by mouth 2 (two) times daily.      . simvastatin (ZOCOR) 20 MG tablet Take 20 mg by mouth at bedtime.      . TRAVATAN Z 0.004 % SOLN ophthalmic solution At bedtime.      . vardenafil (LEVITRA) 20 MG tablet Take 20 mg by mouth daily as needed.      . vitamin E 400 UNIT capsule Take 400 Units by mouth daily.       No current facility-administered medications on file prior to visit.   No Known Allergies  ROS: [x]  Positive   [ ]  Negative   [ ]  All sytems reviewed and are negative  General: [ ]  Weight loss, [ ]  Fever, [ ]  chills Neurologic: [ ]  Dizziness, [ ]  Blackouts, [ ]  Seizure [ ]  Stroke, [ ]  "Mini stroke", [ ]  Slurred speech, [ ]  Temporary blindness; [ ]  weakness in arms or legs, [ ]  Hoarseness Cardiac: [ ]  Chest pain/pressure, [ ]  Shortness of breath at rest [ ]  Shortness of breath with exertion, [ ]   Atrial fibrillation or irregular heartbeat Vascular: [ ]  Pain in legs with walking, [ ]  Pain in legs at rest, [ ]  Pain in legs at night,  [ ]  Non-healing ulcer, [ ]  Blood clot in vein/DVT,   Pulmonary: [ ]  Home oxygen, [ ]  Productive cough, [ ]  Coughing up blood, [ ]  Asthma,  [ ]  Wheezing Musculoskeletal:  [ ]  Arthritis, [ ]  Low back pain, [ ]  Joint pain Hematologic: [ ]  Easy Bruising, [ ]  Anemia; [ ]  Hepatitis Gastrointestinal: [ ]  Blood in stool, [ ]  Gastroesophageal Reflux/heartburn, [ ]  Trouble swallowing Urinary: [ ]  chronic Kidney disease, [ ]  on HD - [ ]  MWF or [ ]  TTHS, [ ]  Burning with urination, [ ]  Difficulty urinating Skin: [ ]  Rashes, [ ]  Wounds Psychological: [ ]  Anxiety, [ ]  Depression  Physical Examination  Filed Vitals:   10/05/13 0853  BP: 144/72  Pulse: 75  Resp: 16  Height: 5\' 8"  (1.727 m)  Weight: 145 lb (65.772 kg)  SpO2: 99%   Body mass index is 22.05 kg/(m^2).  General: A&O x 3, WD.  Pulmonary: Sym exp, good air  movt, CTAB, no rales, rhonchi, or wheezing.   Cardiac: RRR, Nl S1, S2, no Murmurs, rubs or gallops.  Carotid Bruits Left Right   Negative Negative   Aorta is palpable. Radial pulses are 3+ and palpable.                          VASCULAR EXAM:                                                                                                         LE Pulses LEFT RIGHT       FEMORAL   palpable   palpable        POPLITEAL   palpable    palpable       POSTERIOR TIBIAL   palpable    palpable        DORSALIS PEDIS      ANTERIOR TIBIAL  palpable   palpable      Gastrointestinal: soft, NTND, -G/R, - HSM, - masses, - CVAT B.  Musculoskeletal: M/S 5/5 throughout, Extremities without ischemic changes.   Neurologic: CN 2-12 intact, Pain and light touch intact in extremities, Motor exam as listed above.  Non-Invasive Vascular Imaging  AAA Duplex (10/05/2013)  Previous size: 4.55 cm (Date: 09/29/2012)  Current size:  4.26 cm (Date: 10/05/2013)  Medical Decision Making  The patient is a 78 y.o. male who presents with asymptomatic AAA with no increase in size.   Based on this patient's exam and diagnostic studies, the patient will follow up in 1 year  with the following studies: AAA Duplex.  The threshold for repair is AAA size > 5.5 cm, growth > 1 cm/yr, and symptomatic status.  I emphasized the importance of maximal medical management including strict control of blood pressure, blood glucose, and lipid levels, antiplatelet agents, obtaining regular exercise, and continued cessation of smoking.   The patient was  given information about AAA including signs, symptoms, treatment, and how to minimize the risk of enlargement and rupture of aneurysms.    The patient was advised to call 911 should the patient experience sudden onset abdominal or back pain.   Thank you for allowing [place] to participate in this patient's care.  Charisse March, RN, MSN, FNP-C [facility] [place] Office: [phone]  Clinic Physician: Imogene Burn  10/05/2013, 8:56 AM

## 2013-10-06 DIAGNOSIS — R05 Cough: Secondary | ICD-10-CM | POA: Diagnosis not present

## 2013-10-06 DIAGNOSIS — L259 Unspecified contact dermatitis, unspecified cause: Secondary | ICD-10-CM | POA: Diagnosis not present

## 2013-12-11 DIAGNOSIS — L255 Unspecified contact dermatitis due to plants, except food: Secondary | ICD-10-CM | POA: Diagnosis not present

## 2013-12-25 DIAGNOSIS — H4011X0 Primary open-angle glaucoma, stage unspecified: Secondary | ICD-10-CM | POA: Diagnosis not present

## 2013-12-25 DIAGNOSIS — H2510 Age-related nuclear cataract, unspecified eye: Secondary | ICD-10-CM | POA: Diagnosis not present

## 2014-02-18 DIAGNOSIS — Z125 Encounter for screening for malignant neoplasm of prostate: Secondary | ICD-10-CM | POA: Diagnosis not present

## 2014-02-18 DIAGNOSIS — Z23 Encounter for immunization: Secondary | ICD-10-CM | POA: Diagnosis not present

## 2014-02-18 DIAGNOSIS — E7800 Pure hypercholesterolemia, unspecified: Secondary | ICD-10-CM | POA: Diagnosis not present

## 2014-02-18 DIAGNOSIS — Z79899 Other long term (current) drug therapy: Secondary | ICD-10-CM | POA: Diagnosis not present

## 2014-02-18 DIAGNOSIS — N529 Male erectile dysfunction, unspecified: Secondary | ICD-10-CM | POA: Diagnosis not present

## 2014-02-18 DIAGNOSIS — Z0000 Encounter for general adult medical examination without abnormal findings: Secondary | ICD-10-CM | POA: Diagnosis not present

## 2014-02-18 DIAGNOSIS — K219 Gastro-esophageal reflux disease without esophagitis: Secondary | ICD-10-CM | POA: Diagnosis not present

## 2014-02-18 DIAGNOSIS — I10 Essential (primary) hypertension: Secondary | ICD-10-CM | POA: Diagnosis not present

## 2014-02-21 DIAGNOSIS — K219 Gastro-esophageal reflux disease without esophagitis: Secondary | ICD-10-CM | POA: Diagnosis not present

## 2014-04-22 DIAGNOSIS — K4090 Unilateral inguinal hernia, without obstruction or gangrene, not specified as recurrent: Secondary | ICD-10-CM | POA: Diagnosis not present

## 2014-04-22 DIAGNOSIS — N402 Nodular prostate without lower urinary tract symptoms: Secondary | ICD-10-CM | POA: Diagnosis not present

## 2014-04-22 DIAGNOSIS — N138 Other obstructive and reflux uropathy: Secondary | ICD-10-CM | POA: Diagnosis not present

## 2014-04-22 DIAGNOSIS — R972 Elevated prostate specific antigen [PSA]: Secondary | ICD-10-CM | POA: Diagnosis not present

## 2014-04-22 DIAGNOSIS — N401 Enlarged prostate with lower urinary tract symptoms: Secondary | ICD-10-CM | POA: Diagnosis not present

## 2014-05-15 DIAGNOSIS — H4011X0 Primary open-angle glaucoma, stage unspecified: Secondary | ICD-10-CM | POA: Diagnosis not present

## 2014-05-15 DIAGNOSIS — H2510 Age-related nuclear cataract, unspecified eye: Secondary | ICD-10-CM | POA: Diagnosis not present

## 2014-05-15 DIAGNOSIS — H524 Presbyopia: Secondary | ICD-10-CM | POA: Diagnosis not present

## 2014-07-05 DIAGNOSIS — L821 Other seborrheic keratosis: Secondary | ICD-10-CM | POA: Diagnosis not present

## 2014-07-05 DIAGNOSIS — D485 Neoplasm of uncertain behavior of skin: Secondary | ICD-10-CM | POA: Diagnosis not present

## 2014-08-26 DIAGNOSIS — K5730 Diverticulosis of large intestine without perforation or abscess without bleeding: Secondary | ICD-10-CM | POA: Diagnosis not present

## 2014-08-26 DIAGNOSIS — Z86010 Personal history of colonic polyps: Secondary | ICD-10-CM | POA: Diagnosis not present

## 2014-08-29 DIAGNOSIS — K4090 Unilateral inguinal hernia, without obstruction or gangrene, not specified as recurrent: Secondary | ICD-10-CM | POA: Diagnosis not present

## 2014-09-16 DIAGNOSIS — I10 Essential (primary) hypertension: Secondary | ICD-10-CM | POA: Diagnosis not present

## 2014-09-16 DIAGNOSIS — D176 Benign lipomatous neoplasm of spermatic cord: Secondary | ICD-10-CM | POA: Diagnosis not present

## 2014-09-16 DIAGNOSIS — E785 Hyperlipidemia, unspecified: Secondary | ICD-10-CM | POA: Diagnosis not present

## 2014-09-16 DIAGNOSIS — Z87891 Personal history of nicotine dependence: Secondary | ICD-10-CM | POA: Diagnosis not present

## 2014-09-16 DIAGNOSIS — K4090 Unilateral inguinal hernia, without obstruction or gangrene, not specified as recurrent: Secondary | ICD-10-CM | POA: Diagnosis not present

## 2014-09-16 DIAGNOSIS — H409 Unspecified glaucoma: Secondary | ICD-10-CM | POA: Diagnosis not present

## 2014-09-16 DIAGNOSIS — Z79899 Other long term (current) drug therapy: Secondary | ICD-10-CM | POA: Diagnosis not present

## 2014-09-28 HISTORY — PX: INGUINAL HERNIA REPAIR: SHX194

## 2014-10-08 DIAGNOSIS — Z23 Encounter for immunization: Secondary | ICD-10-CM | POA: Diagnosis not present

## 2014-10-10 ENCOUNTER — Encounter: Payer: Self-pay | Admitting: Family

## 2014-10-11 ENCOUNTER — Ambulatory Visit (INDEPENDENT_AMBULATORY_CARE_PROVIDER_SITE_OTHER): Payer: Medicare Other | Admitting: Family

## 2014-10-11 ENCOUNTER — Encounter: Payer: Self-pay | Admitting: Family

## 2014-10-11 ENCOUNTER — Ambulatory Visit (HOSPITAL_COMMUNITY)
Admission: RE | Admit: 2014-10-11 | Discharge: 2014-10-11 | Disposition: A | Discharge status: Home / Self Care | Payer: Medicare Other | Source: Ambulatory Visit | Attending: Vascular Surgery | Admitting: Vascular Surgery

## 2014-10-11 VITALS — BP 137/81 | HR 58 | Resp 14 | Ht 68.0 in | Wt 147.0 lb

## 2014-10-11 DIAGNOSIS — I714 Abdominal aortic aneurysm, without rupture, unspecified: Secondary | ICD-10-CM

## 2014-10-11 NOTE — Progress Notes (Signed)
[facility] [place]  Established Abdominal Aortic Aneurysm  History of Present Illness  Steven Mccormick is a 79 y.o. (June 21, 1935) male patient of Dr. Bridgett Larsson who presents with chief complaint: follow up for AAA. Previous studies, October, 2013, demonstrate an AAA, measuring 4.55 cm. The patient does denies have back or abdominal pain. The patient is a former smoker, quit at age 79.  The patient denies claudication in legs with walking.  The patient denies history of stroke or TIA symptoms.  He states his systolic pressure is usually about 120.  Pt Diabetic: No    Past Medical History  Diagnosis Date  . Hyperlipidemia   . Hypertension   . GERD (gastroesophageal reflux disease)   . ED (erectile dysfunction)   . Palpitations   . AAA (abdominal aortic aneurysm)    Past Surgical History  Procedure Laterality Date  . Inguinal hernia repair      Right inguinal hernia  . Hemorroidectomy    . Nephrectomy  1969    Donor for his brother  . Kidney donation  11  . Inguinal hernia repair Left Oct. 17, 2015   Social History History   Social History  . Marital Status: Unknown    Spouse Name: N/A    Number of Children: N/A  . Years of Education: N/A   Occupational History  . Not on file.   Social History Main Topics  . Smoking status: Former Smoker    Quit date: 02/17/1992  . Smokeless tobacco: Never Used  . Alcohol Use: Yes     Comment: Occasional use  . Drug Use: No  . Sexual Activity: Not on file   Other Topics Concern  . Not on file   Social History Narrative  . No narrative on file   Family History Family History  Problem Relation Age of Onset  . Cancer Brother     prostate cancer  . Anuerysm Father     abdominal aortic  . Varicose Veins Mother     Current Outpatient Prescriptions on File Prior to Visit  Medication Sig Dispense Refill  . amLODipine (NORVASC) 10 MG tablet Take 10 mg by mouth daily.      Marland Kitchen aspirin 81 MG tablet Take 81 mg by  mouth daily.      . bisoprolol-hydrochlorothiazide (ZIAC) 5-6.25 MG per tablet Take 1 tablet by mouth daily.      Marland Kitchen lisinopril (PRINIVIL,ZESTRIL) 10 MG tablet       . ranitidine (ZANTAC) 150 MG tablet Take 150 mg by mouth as needed.       . simvastatin (ZOCOR) 20 MG tablet Take 20 mg by mouth at bedtime.      . TRAVATAN Z 0.004 % SOLN ophthalmic solution At bedtime.      . vitamin E 400 UNIT capsule Take 400 Units by mouth daily.      . fish oil-omega-3 fatty acids 1000 MG capsule Take 1 g by mouth daily.      . vardenafil (LEVITRA) 20 MG tablet Take 20 mg by mouth daily as needed.       No current facility-administered medications on file prior to visit.   No Known Allergies  ROS: See HPI for pertinent positives and negatives.  Physical Examination  Filed Vitals:   10/11/14 0910  BP: 137/81  Pulse: 58  Resp: 14  Height: 5\' 8"  (1.727 m)  Weight: 147 lb (66.679 kg)  SpO2: 99%   Body mass index is 22.36 kg/(m^2).  General: A&O x 3, WD.  Pulmonary: Sym exp, good air movt, CTAB, no rales, rhonchi, or wheezing.  Cardiac: RRR, Nl S1, S2, no Murmur detected.   Carotid Bruits  Left  Right    Negative  Negative   Aorta is palpable.  Radial pulses are 2+ and palpable.  VASCULAR EXAM:  LE Pulses  LEFT  RIGHT   FEMORAL  palpable  palpable   POPLITEAL  palpable  palpable   POSTERIOR TIBIAL  palpable  palpable   DORSALIS PEDIS  ANTERIOR TIBIAL  Not palpable  Not palpable    Gastrointestinal: soft, NTND, -G/R, - HSM, - masses palpated, - CVAT B.  Musculoskeletal: M/S 5/5 throughout, Extremities without ischemic changes.  Neurologic: CN 2-12 intact, Pain and light touch intact in extremities, Motor exam as listed above.     Non-Invasive Vascular Imaging  AAA Duplex (10/11/2014)  Previous size: 4.26 cm (Date: 10/05/13)  Current size:  4.5 cm (Date: 10/11/2014)  February 2013 largest diameter was 4.55 cm  Medical Decision Making  The patient is a 79 y.o. male who presents  with symptomatic AAA with no increase in size since February, 2013.   Based on this patient's exam and diagnostic studies, the patient will follow up in 6 months  with the following studies: AAA Duplex.  Consideration for repair of AAA would be made when the size is 5.5 cm, growth > 1 cm/yr, and symptomatic status.  I emphasized the importance of maximal medical management including strict control of blood pressure, blood glucose, and lipid levels, antiplatelet agents, obtaining regular exercise, and continued cessation of smoking.   The patient was given information about AAA including signs, symptoms, treatment, and how to minimize the risk of enlargement and rupture of aneurysms.    The patient was advised to call 911 should the patient experience sudden onset abdominal or back pain.   Thank you for allowing [place] to participate in this patient's care.  Clemon Chambers, RN, MSN, FNP-C [facility] [place] Office: [phone]  Clinic Physician: Bridgett Larsson  10/11/2014, 9:17 AM

## 2014-10-11 NOTE — Patient Instructions (Signed)
Abdominal Aortic Aneurysm An aneurysm is a weakened or damaged part of an artery wall that bulges from the normal force of blood pumping through the body. An abdominal aortic aneurysm is an aneurysm that occurs in the lower part of the aorta, the main artery of the body.  The major concern with an abdominal aortic aneurysm is that it can enlarge and burst (rupture) or blood can flow between the layers of the wall of the aorta through a tear (aorticdissection). Both of these conditions can cause bleeding inside the body and can be life threatening unless diagnosed and treated promptly. CAUSES  The exact cause of an abdominal aortic aneurysm is unknown. Some contributing factors are:   A hardening of the arteries caused by the buildup of fat and other substances in the lining of a blood vessel (arteriosclerosis).  Inflammation of the walls of an artery (arteritis).   Connective tissue diseases, such as Marfan syndrome.   Abdominal trauma.   An infection, such as syphilis or staphylococcus, in the wall of the aorta (infectious aortitis) caused by bacteria. RISK FACTORS  Risk factors that contribute to an abdominal aortic aneurysm may include:  Age older than 60 years.   High blood pressure (hypertension).  Male gender.  Ethnicity (white race).  Obesity.  Family history of aneurysm (first degree relatives only).  Tobacco use. PREVENTION  The following healthy lifestyle habits may help decrease your risk of abdominal aortic aneurysm:  Quitting smoking. Smoking can raise your blood pressure and cause arteriosclerosis.  Limiting or avoiding alcohol.  Keeping your blood pressure, blood sugar level, and cholesterol levels within normal limits.  Decreasing your salt intake. In somepeople, too much salt can raise blood pressure and increase your risk of abdominal aortic aneurysm.  Eating a diet low in saturated fats and cholesterol.  Increasing your fiber intake by including  whole grains, vegetables, and fruits in your diet. Eating these foods may help lower blood pressure.  Maintaining a healthy weight.  Staying physically active and exercising regularly. SYMPTOMS  The symptoms of abdominal aortic aneurysm may vary depending on the size and rate of growth of the aneurysm.Most grow slowly and do not have any symptoms. When symptoms do occur, they may include:  Pain (abdomen, side, lower back, or groin). The pain may vary in intensity. A sudden onset of severe pain may indicate that the aneurysm has ruptured.  Feeling full after eating only small amounts of food.  Nausea or vomiting or both.  Feeling a pulsating lump in the abdomen.  Feeling faint or passing out. DIAGNOSIS  Since most unruptured abdominal aortic aneurysms have no symptoms, they are often discovered during diagnostic exams for other conditions. An aneurysm may be found during the following procedures:  Ultrasonography (A one-time screening for abdominal aortic aneurysm by ultrasonography is also recommended for all men aged 65-75 years who have ever smoked).  X-ray exams.  A computed tomography (CT).  Magnetic resonance imaging (MRI).  Angiography or arteriography. TREATMENT  Treatment of an abdominal aortic aneurysm depends on the size of your aneurysm, your age, and risk factors for rupture. Medication to control blood pressure and pain may be used to manage aneurysms smaller than 6 cm. Regular monitoring for enlargement may be recommended by your caregiver if:  The aneurysm is 3-4 cm in size (an annual ultrasonography may be recommended).  The aneurysm is 4-4.5 cm in size (an ultrasonography every 6 months may be recommended).  The aneurysm is larger than 4.5 cm in   size (your caregiver may ask that you be examined by a vascular surgeon). If your aneurysm is larger than 6 cm, surgical repair may be recommended. There are two main methods for repair of an aneurysm:   Endovascular  repair (a minimally invasive surgery). This is done most often.  Open repair. This method is used if an endovascular repair is not possible. Document Released: 09/08/2005 Document Revised: 03/26/2013 Document Reviewed: 12/29/2012 Exit[employer] Patient Information 2015 [employer]. This information is not intended to replace advice given to you by your health care provider. Make sure you discuss any questions you have with your health care provider.  

## 2014-10-11 NOTE — Addendum Note (Signed)
Addended by: Mena Goes on: 10/11/2014 10:47 AM   Modules accepted: Orders

## 2014-10-22 DIAGNOSIS — R972 Elevated prostate specific antigen [PSA]: Secondary | ICD-10-CM | POA: Diagnosis not present

## 2014-11-14 DIAGNOSIS — R972 Elevated prostate specific antigen [PSA]: Secondary | ICD-10-CM | POA: Diagnosis not present

## 2014-11-14 DIAGNOSIS — C61 Malignant neoplasm of prostate: Secondary | ICD-10-CM | POA: Diagnosis not present

## 2014-11-14 DIAGNOSIS — Z125 Encounter for screening for malignant neoplasm of prostate: Secondary | ICD-10-CM | POA: Diagnosis not present

## 2014-11-25 DIAGNOSIS — R972 Elevated prostate specific antigen [PSA]: Secondary | ICD-10-CM | POA: Diagnosis not present

## 2014-12-26 DIAGNOSIS — H4011X2 Primary open-angle glaucoma, moderate stage: Secondary | ICD-10-CM | POA: Diagnosis not present

## 2015-04-17 ENCOUNTER — Encounter: Payer: Self-pay | Admitting: Family

## 2015-04-18 ENCOUNTER — Other Ambulatory Visit: Payer: Self-pay | Admitting: Surgery

## 2015-04-18 ENCOUNTER — Ambulatory Visit (HOSPITAL_COMMUNITY)
Admission: RE | Admit: 2015-04-18 | Discharge: 2015-04-18 | Disposition: A | Discharge status: Home / Self Care | Payer: Medicare Other | Source: Ambulatory Visit | Attending: Family | Admitting: Family

## 2015-04-18 ENCOUNTER — Encounter: Payer: Self-pay | Admitting: Family

## 2015-04-18 ENCOUNTER — Ambulatory Visit (INDEPENDENT_AMBULATORY_CARE_PROVIDER_SITE_OTHER): Payer: Medicare Other | Admitting: Family

## 2015-04-18 VITALS — BP 139/72 | HR 53 | Resp 14 | Ht 68.0 in | Wt 146.0 lb

## 2015-04-18 DIAGNOSIS — Z87891 Personal history of nicotine dependence: Secondary | ICD-10-CM | POA: Insufficient documentation

## 2015-04-18 DIAGNOSIS — R0989 Other specified symptoms and signs involving the circulatory and respiratory systems: Secondary | ICD-10-CM | POA: Diagnosis not present

## 2015-04-18 DIAGNOSIS — I714 Abdominal aortic aneurysm, without rupture, unspecified: Secondary | ICD-10-CM

## 2015-04-18 LAB — VAS US AAA DUPLEX
ABAODAP: 4.45 cm
ABAODTRN: 4.45 cm
ABAOPVEL: 92 cm/s
ABLILTRN: 1.04 cm
ABRILAP: 0.98 cm
ABRILTRN: 1.04 cm
Abdominal dist aorta vel: 69 cm/s
Abdominal lt com iliac AP: 1.06 cm
Abdominal lt com iliac vel: 178 cm/s
Abdominal mid aorta AP: 2.01 cm
Abdominal mid aorta trans: 1.99 cm
Abdominal mid aorta vel: 78 cm/s
Abdominal prox aorta AP: 1.78 cm
Abdominal prox aorta trans: 1.71 cm
Abdominal rt com iliac vel: 67 cm/s

## 2015-04-18 NOTE — Patient Instructions (Signed)
Abdominal Aortic Aneurysm An aneurysm is a weakened or damaged part of an artery wall that bulges from the normal force of blood pumping through the body. An abdominal aortic aneurysm is an aneurysm that occurs in the lower part of the aorta, the main artery of the body.  The major concern with an abdominal aortic aneurysm is that it can enlarge and burst (rupture) or blood can flow between the layers of the wall of the aorta through a tear (aorticdissection). Both of these conditions can cause bleeding inside the body and can be life threatening unless diagnosed and treated promptly. CAUSES  The exact cause of an abdominal aortic aneurysm is unknown. Some contributing factors are:   A hardening of the arteries caused by the buildup of fat and other substances in the lining of a blood vessel (arteriosclerosis).  Inflammation of the walls of an artery (arteritis).   Connective tissue diseases, such as Marfan syndrome.   Abdominal trauma.   An infection, such as syphilis or staphylococcus, in the wall of the aorta (infectious aortitis) caused by bacteria. RISK FACTORS  Risk factors that contribute to an abdominal aortic aneurysm may include:  Age older than 60 years.   High blood pressure (hypertension).  Male gender.  Ethnicity (white race).  Obesity.  Family history of aneurysm (first degree relatives only).  Tobacco use. PREVENTION  The following healthy lifestyle habits may help decrease your risk of abdominal aortic aneurysm:  Quitting smoking. Smoking can raise your blood pressure and cause arteriosclerosis.  Limiting or avoiding alcohol.  Keeping your blood pressure, blood sugar level, and cholesterol levels within normal limits.  Decreasing your salt intake. In somepeople, too much salt can raise blood pressure and increase your risk of abdominal aortic aneurysm.  Eating a diet low in saturated fats and cholesterol.  Increasing your fiber intake by including  whole grains, vegetables, and fruits in your diet. Eating these foods may help lower blood pressure.  Maintaining a healthy weight.  Staying physically active and exercising regularly. SYMPTOMS  The symptoms of abdominal aortic aneurysm may vary depending on the size and rate of growth of the aneurysm.Most grow slowly and do not have any symptoms. When symptoms do occur, they may include:  Pain (abdomen, side, lower back, or groin). The pain may vary in intensity. A sudden onset of severe pain may indicate that the aneurysm has ruptured.  Feeling full after eating only small amounts of food.  Nausea or vomiting or both.  Feeling a pulsating lump in the abdomen.  Feeling faint or passing out. DIAGNOSIS  Since most unruptured abdominal aortic aneurysms have no symptoms, they are often discovered during diagnostic exams for other conditions. An aneurysm may be found during the following procedures:  Ultrasonography (A one-time screening for abdominal aortic aneurysm by ultrasonography is also recommended for all men aged 65-75 years who have ever smoked).  X-ray exams.  A computed tomography (CT).  Magnetic resonance imaging (MRI).  Angiography or arteriography. TREATMENT  Treatment of an abdominal aortic aneurysm depends on the size of your aneurysm, your age, and risk factors for rupture. Medication to control blood pressure and pain may be used to manage aneurysms smaller than 6 cm. Regular monitoring for enlargement may be recommended by your caregiver if:  The aneurysm is 3-4 cm in size (an annual ultrasonography may be recommended).  The aneurysm is 4-4.5 cm in size (an ultrasonography every 6 months may be recommended).  The aneurysm is larger than 4.5 cm in   size (your caregiver may ask that you be examined by a vascular surgeon). If your aneurysm is larger than 6 cm, surgical repair may be recommended. There are two main methods for repair of an aneurysm:   Endovascular  repair (a minimally invasive surgery). This is done most often.  Open repair. This method is used if an endovascular repair is not possible. Document Released: 09/08/2005 Document Revised: 03/26/2013 Document Reviewed: 12/29/2012 Exit[employer] Patient Information 2015 [employer]. This information is not intended to replace advice given to you by your health care provider. Make sure you discuss any questions you have with your health care provider.  

## 2015-04-18 NOTE — Progress Notes (Signed)
[facility] [place]  Established Abdominal Aortic Aneurysm  History of Present Illness  Steven Mccormick is a 80 y.o. (07-26-1935) male  patient of Dr. Bridgett Larsson who presents with chief complaint: follow up for AAA. Previous studies, October, 2013, demonstrate an AAA, measuring 4.55 cm. The patient does denies have back or abdominal pain. The patient is a former smoker, quit at age 15.  The patient denies claudication in legs with walking.  The patient denies history of stroke or TIA symptoms.  He states his systolic pressure is usually about 120.  He rarely drinks ETOH, is physically active.  Pt Diabetic: No  Past Medical History  Diagnosis Date  . Hyperlipidemia   . Hypertension   . GERD (gastroesophageal reflux disease)   . ED (erectile dysfunction)   . Palpitations   . AAA (abdominal aortic aneurysm)    Past Surgical History  Procedure Laterality Date  . Inguinal hernia repair      Right inguinal hernia  . Hemorroidectomy    . Nephrectomy  1969    Donor for his brother  . Kidney donation  42  . Inguinal hernia repair Left Oct. 17, 2015   Social History History   Social History  . Marital Status: Unknown    Spouse Name: N/A  . Number of Children: N/A  . Years of Education: N/A   Occupational History  . Not on file.   Social History Main Topics  . Smoking status: Former Smoker    Quit date: 02/17/1992  . Smokeless tobacco: Never Used  . Alcohol Use: Yes     Comment: Occasional use  . Drug Use: No  . Sexual Activity: Not on file   Other Topics Concern  . Not on file   Social History Narrative   Family History Family History  Problem Relation Age of Onset  . Cancer Brother     prostate cancer  . Anuerysm Father     abdominal aortic  . Varicose Veins Mother   . Cancer Sister     Lung  . Heart attack Sister     Current Outpatient Prescriptions on File Prior to Visit  Medication Sig Dispense Refill  . amLODipine (NORVASC) 10 MG  tablet Take 10 mg by mouth daily.    Marland Kitchen aspirin 81 MG tablet Take 81 mg by mouth daily.    . bisoprolol-hydrochlorothiazide (ZIAC) 5-6.25 MG per tablet Take 1 tablet by mouth daily.    . fish oil-omega-3 fatty acids 1000 MG capsule Take 1 g by mouth daily.    Marland Kitchen lisinopril (PRINIVIL,ZESTRIL) 10 MG tablet     . ranitidine (ZANTAC) 150 MG tablet Take 150 mg by mouth as needed.     . simvastatin (ZOCOR) 20 MG tablet Take 20 mg by mouth at bedtime.    . TRAVATAN Z 0.004 % SOLN ophthalmic solution At bedtime.    . vitamin E 400 UNIT capsule Take 400 Units by mouth daily.    . vardenafil (LEVITRA) 20 MG tablet Take 20 mg by mouth daily as needed.     No current facility-administered medications on file prior to visit.   No Known Allergies  ROS: See HPI for pertinent positives and negatives.  Physical Examination  Filed Vitals:   04/18/15 0906  BP: 139/72  Pulse: 53  Resp: 14  Height: 5\' 8"  (1.727 m)  Weight: 146 lb (66.225 kg)  SpO2: 100%   Body mass index is 22.2 kg/(m^2).  General: A&O x 3, WD.  Pulmonary: Sym exp, good air movt, CTAB, no rales, rhonchi, or wheezing.  Cardiac: RRR, Nl S1, S2, no Murmur detected.   Carotid Bruits  Left  Right    Negative  Negative   Aorta is palpable.  Radial pulses are 2+ and palpable.   VASCULAR EXAM:  LE Pulses  LEFT  RIGHT   FEMORAL  2+palpable  1+palpable   POPLITEAL  2+palpable  3+palpable   POSTERIOR TIBIAL  2+palpable  2+palpable   DORSALIS PEDIS  ANTERIOR TIBIAL  Not palpable  Not palpable    Gastrointestinal: soft, NTND, -G/R, - HSM, - masses palpated, - CVAT B.  Musculoskeletal: M/S 5/5 throughout, Extremities without ischemic changes.  Neurologic: CN 2-12 intact, Pain and light touch intact in extremities, Motor exam as listed above.        Non-Invasive Vascular Imaging  AAA Duplex (04/18/2015) ABDOMINAL AORTA DUPLEX EVALUATION    INDICATION: Evaluation of abdominal aorta.    PREVIOUS  INTERVENTION(S):     DUPLEX EXAM:     LOCATION DIAMETER AP (cm) DIAMETER TRANSVERSE (cm) VELOCITIES (cm/sec)  Aorta Proximal 1.78 1.71 92  Aorta Mid 2.01 1.99 78  Aorta Distal 4.45 4.45 69  Right Common Iliac Artery 0.98 1.04 67  Left Common Iliac Artery 1.06 1.04 178    Previous max aortic diameter:  4.5 x 4.5 Date: 10/11/2014  ADDITIONAL FINDINGS:     IMPRESSION: Patent abdominal aortic aneurysm measuring approximately 4.45 x 4.45cm in diameter.     Compared to the previous exam:  No significant change in comparison to the last exam on 10/11/2014.     Medical Decision Making  The patient is a 80 y.o. male who presents with asymptomatic AAA with no increase in size; largest diameter today is 4.5 cm.  Promininent bilateral popliteal pulses; bilateral lower extremity arterial Duplex done in April 2013, no popliteal aneurysms at that time. He has no claudication symptoms with walking, stays physically active.    Based on this patient's exam and diagnostic studies, the patient will follow up in 6 months with the following studies: AAA Duplex and bilateral popliteal arterial Duplex.  Consideration for repair of AAA would be made when the size is 5.5 cm, growth > 1 cm/yr, and symptomatic status.  I emphasized the importance of maximal medical management including strict control of blood pressure, blood glucose, and lipid levels, antiplatelet agents, obtaining regular exercise, and continued cessation of smoking.   The patient was given information about AAA including signs, symptoms, treatment, and how to minimize the risk of enlargement and rupture of aneurysms.    The patient was advised to call 911 should the patient experience sudden onset abdominal or back pain.   Thank you for allowing [place] to participate in this patient's care.  Clemon Chambers, RN, MSN, FNP-C [facility] [place] Office: [phone]  Clinic Physician: Bridgett Larsson  04/18/2015, 9:17  AM

## 2015-04-24 DIAGNOSIS — H2513 Age-related nuclear cataract, bilateral: Secondary | ICD-10-CM | POA: Diagnosis not present

## 2015-04-24 DIAGNOSIS — H4011X2 Primary open-angle glaucoma, moderate stage: Secondary | ICD-10-CM | POA: Diagnosis not present

## 2015-05-15 DIAGNOSIS — H4011X2 Primary open-angle glaucoma, moderate stage: Secondary | ICD-10-CM | POA: Diagnosis not present

## 2015-06-09 DIAGNOSIS — Z125 Encounter for screening for malignant neoplasm of prostate: Secondary | ICD-10-CM | POA: Diagnosis not present

## 2015-06-09 DIAGNOSIS — C61 Malignant neoplasm of prostate: Secondary | ICD-10-CM | POA: Diagnosis not present

## 2015-06-09 DIAGNOSIS — Z1389 Encounter for screening for other disorder: Secondary | ICD-10-CM | POA: Diagnosis not present

## 2015-06-09 DIAGNOSIS — Z9181 History of falling: Secondary | ICD-10-CM | POA: Diagnosis not present

## 2015-06-09 DIAGNOSIS — Z0000 Encounter for general adult medical examination without abnormal findings: Secondary | ICD-10-CM | POA: Diagnosis not present

## 2015-06-23 DIAGNOSIS — R972 Elevated prostate specific antigen [PSA]: Secondary | ICD-10-CM | POA: Diagnosis not present

## 2015-06-23 DIAGNOSIS — Z6822 Body mass index (BMI) 22.0-22.9, adult: Secondary | ICD-10-CM | POA: Diagnosis not present

## 2015-09-15 DIAGNOSIS — H401132 Primary open-angle glaucoma, bilateral, moderate stage: Secondary | ICD-10-CM | POA: Diagnosis not present

## 2015-10-02 DIAGNOSIS — D045 Carcinoma in situ of skin of trunk: Secondary | ICD-10-CM | POA: Diagnosis not present

## 2015-10-02 DIAGNOSIS — L570 Actinic keratosis: Secondary | ICD-10-CM | POA: Diagnosis not present

## 2015-10-02 DIAGNOSIS — D0461 Carcinoma in situ of skin of right upper limb, including shoulder: Secondary | ICD-10-CM | POA: Diagnosis not present

## 2015-10-02 DIAGNOSIS — L821 Other seborrheic keratosis: Secondary | ICD-10-CM | POA: Diagnosis not present

## 2015-10-02 DIAGNOSIS — D485 Neoplasm of uncertain behavior of skin: Secondary | ICD-10-CM | POA: Diagnosis not present

## 2015-10-17 ENCOUNTER — Other Ambulatory Visit (HOSPITAL_COMMUNITY): Payer: Medicare Other

## 2015-10-17 ENCOUNTER — Ambulatory Visit: Payer: Medicare Other | Admitting: Family

## 2015-10-21 ENCOUNTER — Encounter: Payer: Self-pay | Admitting: Family

## 2015-10-24 ENCOUNTER — Encounter: Payer: Self-pay | Admitting: Family

## 2015-10-24 ENCOUNTER — Ambulatory Visit (INDEPENDENT_AMBULATORY_CARE_PROVIDER_SITE_OTHER)
Admission: RE | Admit: 2015-10-24 | Discharge: 2015-10-24 | Disposition: A | Discharge status: Home / Self Care | Payer: Medicare Other | Source: Ambulatory Visit | Attending: Family | Admitting: Family

## 2015-10-24 ENCOUNTER — Ambulatory Visit (INDEPENDENT_AMBULATORY_CARE_PROVIDER_SITE_OTHER): Payer: Medicare Other | Admitting: Family

## 2015-10-24 ENCOUNTER — Ambulatory Visit (HOSPITAL_COMMUNITY)
Admission: RE | Admit: 2015-10-24 | Discharge: 2015-10-24 | Disposition: A | Discharge status: Home / Self Care | Payer: Medicare Other | Source: Ambulatory Visit | Attending: Family | Admitting: Family

## 2015-10-24 VITALS — BP 141/79 | HR 55 | Temp 97.6°F | Resp 14 | Ht 68.0 in | Wt 146.0 lb

## 2015-10-24 DIAGNOSIS — I714 Abdominal aortic aneurysm, without rupture, unspecified: Secondary | ICD-10-CM

## 2015-10-24 DIAGNOSIS — Z87891 Personal history of nicotine dependence: Secondary | ICD-10-CM

## 2015-10-24 DIAGNOSIS — R0989 Other specified symptoms and signs involving the circulatory and respiratory systems: Secondary | ICD-10-CM

## 2015-10-24 NOTE — Progress Notes (Signed)
[facility] [place]  Established Abdominal Aortic Aneurysm  History of Present Illness  Steven Mccormick is a 80 y.o. (26-Sep-1935) male patient of Dr. Bridgett Larsson who presents with chief complaint: follow up for AAA. Previous studies, October, 2013, demonstrate an AAA, measuring 4.55 cm. The patient does denies have back or abdominal pain. The patient is a former smoker, quit at age 47.  The patient denies claudication in legs with walking.  The patient denies history of stroke or TIA symptoms.  He states his systolic pressure is usually about 120.  He rarely drinks ETOH, is physically active.  Pt Diabetic: No  Pt smoker: former smoker, quit at age 45  Past Medical History  Diagnosis Date  . Hyperlipidemia   . Hypertension   . GERD (gastroesophageal reflux disease)   . ED (erectile dysfunction)   . Palpitations   . AAA (abdominal aortic aneurysm)    Past Surgical History  Procedure Laterality Date  . Inguinal hernia repair      Right inguinal hernia  . Hemorroidectomy    . Nephrectomy  1969    Donor for his brother  . Kidney donation  35  . Inguinal hernia repair Left Oct. 17, 2015   Social History Social History   Social History  . Marital Status: Unknown    Spouse Name: N/A  . Number of Children: N/A  . Years of Education: N/A   Occupational History  . Not on file.   Social History Main Topics  . Smoking status: Former Smoker    Quit date: 02/17/1992  . Smokeless tobacco: Never Used  . Alcohol Use: Yes     Comment: Occasional use  . Drug Use: No  . Sexual Activity: Not on file   Other Topics Concern  . Not on file   Social History Narrative   Family History Family History  Problem Relation Age of Onset  . Cancer Brother     prostate cancer  . Anuerysm Father     abdominal aortic  . Varicose Veins Mother   . Cancer Sister     Lung  . Heart attack Sister     Current Outpatient Prescriptions on File Prior to Visit  Medication  Sig Dispense Refill  . amLODipine (NORVASC) 10 MG tablet Take 10 mg by mouth daily.    Marland Kitchen aspirin 81 MG tablet Take 81 mg by mouth daily.    . bisoprolol-hydrochlorothiazide (ZIAC) 5-6.25 MG per tablet Take 1 tablet by mouth daily.    . fish oil-omega-3 fatty acids 1000 MG capsule Take 1 g by mouth daily.    Marland Kitchen lisinopril (PRINIVIL,ZESTRIL) 10 MG tablet     . ranitidine (ZANTAC) 150 MG tablet Take 150 mg by mouth as needed.     . simvastatin (ZOCOR) 20 MG tablet Take 20 mg by mouth at bedtime.    . TRAVATAN Z 0.004 % SOLN ophthalmic solution At bedtime.    . vardenafil (LEVITRA) 20 MG tablet Take 20 mg by mouth daily as needed.    . vitamin E 400 UNIT capsule Take 400 Units by mouth daily.     No current facility-administered medications on file prior to visit.   No Known Allergies  ROS: See HPI for pertinent positives and negatives.  Physical Examination  Filed Vitals:   10/24/15 0939 10/24/15 0940  BP: 145/70 141/79  Pulse: 55 55  Temp: 97.6 F (36.4 C)   Resp: 14   Height: 5\' 8"  (1.727 m)  Weight: 146 lb (66.225 kg)   SpO2: 97%    Body mass index is 22.2 kg/(m^2).   General: A&O x 3, WD.  Pulmonary: Sym exp, good air movt, CTAB, no rales, rhonchi, or wheezing.  Cardiac: RRR, Nl S1, S2, no Murmur detected.   Carotid Bruits  Left  Right    Negative  Negative   Aorta is palpable.  Radial pulses are 2+ and palpable.   VASCULAR EXAM:  LE Pulses  LEFT  RIGHT   FEMORAL  2+palpable  1+palpable   POPLITEAL  2+palpable  3+palpable   POSTERIOR TIBIAL  2+palpable  2+palpable   DORSALIS PEDIS  ANTERIOR TIBIAL  Not palpable  Not palpable    Gastrointestinal: soft, NTND, -G/R, - HSM, - masses palpated, - CVAT B.  Musculoskeletal: M/S 5/5 throughout, Extremities without ischemic changes.  Neurologic: CN 2-12 intact, Pain and light touch intact in extremities, Motor exam as listed above.                Non-Invasive  Vascular Imaging  AAA Duplex (10/24/2015)  Previous size: 4.45 cm (Date: 04/18/2015)  Current size:  4.54 cm (Date: 10/24/2015)  Medical Decision Making  The patient is a 80 y.o. male who presents with asymptomatic AAA with no significant increase in size. Prominent bilateral popliteal pulses: No popliteal aneurysms on popliteal artery duplex today.   Based on this patient's exam and diagnostic studies, the patient will follow up in 6 months with the following studies: AAA duplex.  Consideration for repair of AAA would be made when the size is 5.5 cm, growth > 1 cm/yr, and symptomatic status.  I emphasized the importance of maximal medical management including strict control of blood pressure, blood glucose, and lipid levels, antiplatelet agents, obtaining regular exercise, and continued  cessation of smoking.   The patient was given information about AAA including signs, symptoms, treatment, and how to minimize the risk of enlargement and rupture of aneurysms.    The patient was advised to call 911 should the patient experience sudden onset abdominal or back pain.   Thank you for allowing [place] to participate in this patient's care.  Clemon Chambers, RN, MSN, FNP-C [facility] [place] Office: [phone]  Clinic Physician: Bridgett Larsson  10/24/2015, 8:48 AM

## 2015-10-24 NOTE — Progress Notes (Signed)
Filed Vitals:   10/24/15 0939 10/24/15 0940  BP: 145/70 141/79  Pulse: 55 55  Temp: 97.6 F (36.4 C)   Resp: 14   Height: 5\' 8"  (1.727 m)   Weight: 146 lb (66.225 kg)   SpO2: 97%

## 2015-10-24 NOTE — Patient Instructions (Signed)
Abdominal Aortic Aneurysm An aneurysm is a weakened or damaged part of an artery wall that bulges from the normal force of blood pumping through the body. An abdominal aortic aneurysm is an aneurysm that occurs in the lower part of the aorta, the main artery of the body.  The major concern with an abdominal aortic aneurysm is that it can enlarge and burst (rupture) or blood can flow between the layers of the wall of the aorta through a tear (aorticdissection). Both of these conditions can cause bleeding inside the body and can be life threatening unless diagnosed and treated promptly. CAUSES  The exact cause of an abdominal aortic aneurysm is unknown. Some contributing factors are:   A hardening of the arteries caused by the buildup of fat and other substances in the lining of a blood vessel (arteriosclerosis).  Inflammation of the walls of an artery (arteritis).   Connective tissue diseases, such as Marfan syndrome.   Abdominal trauma.   An infection, such as syphilis or staphylococcus, in the wall of the aorta (infectious aortitis) caused by bacteria. RISK FACTORS  Risk factors that contribute to an abdominal aortic aneurysm may include:  Age older than 60 years.   High blood pressure (hypertension).  Male gender.  Ethnicity (white race).  Obesity.  Family history of aneurysm (first degree relatives only).  Tobacco use. PREVENTION  The following healthy lifestyle habits may help decrease your risk of abdominal aortic aneurysm:  Quitting smoking. Smoking can raise your blood pressure and cause arteriosclerosis.  Limiting or avoiding alcohol.  Keeping your blood pressure, blood sugar level, and cholesterol levels within normal limits.  Decreasing your salt intake. In somepeople, too much salt can raise blood pressure and increase your risk of abdominal aortic aneurysm.  Eating a diet low in saturated fats and cholesterol.  Increasing your fiber intake by including  whole grains, vegetables, and fruits in your diet. Eating these foods may help lower blood pressure.  Maintaining a healthy weight.  Staying physically active and exercising regularly. SYMPTOMS  The symptoms of abdominal aortic aneurysm may vary depending on the size and rate of growth of the aneurysm.Most grow slowly and do not have any symptoms. When symptoms do occur, they may include:  Pain (abdomen, side, lower back, or groin). The pain may vary in intensity. A sudden onset of severe pain may indicate that the aneurysm has ruptured.  Feeling full after eating only small amounts of food.  Nausea or vomiting or both.  Feeling a pulsating lump in the abdomen.  Feeling faint or passing out. DIAGNOSIS  Since most unruptured abdominal aortic aneurysms have no symptoms, they are often discovered during diagnostic exams for other conditions. An aneurysm may be found during the following procedures:  Ultrasonography (A one-time screening for abdominal aortic aneurysm by ultrasonography is also recommended for all men aged 65-75 years who have ever smoked).  X-ray exams.  A computed tomography (CT).  Magnetic resonance imaging (MRI).  Angiography or arteriography. TREATMENT  Treatment of an abdominal aortic aneurysm depends on the size of your aneurysm, your age, and risk factors for rupture. Medication to control blood pressure and pain may be used to manage aneurysms smaller than 6 cm. Regular monitoring for enlargement may be recommended by your caregiver if:  The aneurysm is 3-4 cm in size (an annual ultrasonography may be recommended).  The aneurysm is 4-4.5 cm in size (an ultrasonography every 6 months may be recommended).  The aneurysm is larger than 4.5 cm in   size (your caregiver may ask that you be examined by a vascular surgeon). If your aneurysm is larger than 6 cm, surgical repair may be recommended. There are two main methods for repair of an aneurysm:   Endovascular  repair (a minimally invasive surgery). This is done most often.  Open repair. This method is used if an endovascular repair is not possible.   This information is not intended to replace advice given to you by your health care provider. Make sure you discuss any questions you have with your health care provider.   Document Released: 09/08/2005 Document Revised: 03/26/2013 Document Reviewed: 12/29/2012 Elsevier Interactive Patient Education 2016 Elsevier Inc.  

## 2015-12-09 DIAGNOSIS — Z79899 Other long term (current) drug therapy: Secondary | ICD-10-CM | POA: Diagnosis not present

## 2015-12-09 DIAGNOSIS — I10 Essential (primary) hypertension: Secondary | ICD-10-CM | POA: Diagnosis not present

## 2015-12-09 DIAGNOSIS — Z125 Encounter for screening for malignant neoplasm of prostate: Secondary | ICD-10-CM | POA: Diagnosis not present

## 2015-12-09 DIAGNOSIS — Z0000 Encounter for general adult medical examination without abnormal findings: Secondary | ICD-10-CM | POA: Diagnosis not present

## 2015-12-09 DIAGNOSIS — E785 Hyperlipidemia, unspecified: Secondary | ICD-10-CM | POA: Diagnosis not present

## 2015-12-09 DIAGNOSIS — C61 Malignant neoplasm of prostate: Secondary | ICD-10-CM | POA: Diagnosis not present

## 2015-12-24 DIAGNOSIS — M654 Radial styloid tenosynovitis [de Quervain]: Secondary | ICD-10-CM | POA: Diagnosis not present

## 2015-12-24 DIAGNOSIS — M25532 Pain in left wrist: Secondary | ICD-10-CM | POA: Diagnosis not present

## 2016-01-05 DIAGNOSIS — R972 Elevated prostate specific antigen [PSA]: Secondary | ICD-10-CM | POA: Diagnosis not present

## 2016-01-05 DIAGNOSIS — Z6822 Body mass index (BMI) 22.0-22.9, adult: Secondary | ICD-10-CM | POA: Diagnosis not present

## 2016-01-21 DIAGNOSIS — M654 Radial styloid tenosynovitis [de Quervain]: Secondary | ICD-10-CM | POA: Diagnosis not present

## 2016-03-19 DIAGNOSIS — D1801 Hemangioma of skin and subcutaneous tissue: Secondary | ICD-10-CM | POA: Diagnosis not present

## 2016-03-19 DIAGNOSIS — L821 Other seborrheic keratosis: Secondary | ICD-10-CM | POA: Diagnosis not present

## 2016-03-19 DIAGNOSIS — L812 Freckles: Secondary | ICD-10-CM | POA: Diagnosis not present

## 2016-03-19 DIAGNOSIS — L570 Actinic keratosis: Secondary | ICD-10-CM | POA: Diagnosis not present

## 2016-03-19 DIAGNOSIS — Z85828 Personal history of other malignant neoplasm of skin: Secondary | ICD-10-CM | POA: Diagnosis not present

## 2016-03-31 DIAGNOSIS — M654 Radial styloid tenosynovitis [de Quervain]: Secondary | ICD-10-CM | POA: Diagnosis not present

## 2016-04-16 ENCOUNTER — Encounter: Payer: Self-pay | Admitting: Family

## 2016-04-23 ENCOUNTER — Ambulatory Visit (INDEPENDENT_AMBULATORY_CARE_PROVIDER_SITE_OTHER): Payer: Medicare Other | Admitting: Family

## 2016-04-23 ENCOUNTER — Ambulatory Visit (HOSPITAL_COMMUNITY)
Admission: RE | Admit: 2016-04-23 | Discharge: 2016-04-23 | Disposition: A | Discharge status: Home / Self Care | Payer: Medicare Other | Source: Ambulatory Visit | Attending: Family | Admitting: Family

## 2016-04-23 ENCOUNTER — Encounter: Payer: Self-pay | Admitting: Family

## 2016-04-23 VITALS — BP 142/73 | HR 56 | Temp 97.2°F | Ht 68.0 in | Wt 143.1 lb

## 2016-04-23 DIAGNOSIS — Z87891 Personal history of nicotine dependence: Secondary | ICD-10-CM | POA: Diagnosis not present

## 2016-04-23 DIAGNOSIS — I10 Essential (primary) hypertension: Secondary | ICD-10-CM | POA: Insufficient documentation

## 2016-04-23 DIAGNOSIS — I714 Abdominal aortic aneurysm, without rupture, unspecified: Secondary | ICD-10-CM

## 2016-04-23 DIAGNOSIS — R0989 Other specified symptoms and signs involving the circulatory and respiratory systems: Secondary | ICD-10-CM | POA: Diagnosis not present

## 2016-04-23 DIAGNOSIS — K219 Gastro-esophageal reflux disease without esophagitis: Secondary | ICD-10-CM | POA: Diagnosis not present

## 2016-04-23 DIAGNOSIS — E785 Hyperlipidemia, unspecified: Secondary | ICD-10-CM | POA: Diagnosis not present

## 2016-04-23 NOTE — Patient Instructions (Signed)
Abdominal Aortic Aneurysm An aneurysm is a weakened or damaged part of an artery wall that bulges from the normal force of blood pumping through the body. An abdominal aortic aneurysm is an aneurysm that occurs in the lower part of the aorta, the main artery of the body.  The major concern with an abdominal aortic aneurysm is that it can enlarge and burst (rupture) or blood can flow between the layers of the wall of the aorta through a tear (aorticdissection). Both of these conditions can cause bleeding inside the body and can be life threatening unless diagnosed and treated promptly. CAUSES  The exact cause of an abdominal aortic aneurysm is unknown. Some contributing factors are:   A hardening of the arteries caused by the buildup of fat and other substances in the lining of a blood vessel (arteriosclerosis).  Inflammation of the walls of an artery (arteritis).   Connective tissue diseases, such as Marfan syndrome.   Abdominal trauma.   An infection, such as syphilis or staphylococcus, in the wall of the aorta (infectious aortitis) caused by bacteria. RISK FACTORS  Risk factors that contribute to an abdominal aortic aneurysm may include:  Age older than 60 years.   High blood pressure (hypertension).  Male gender.  Ethnicity (white race).  Obesity.  Family history of aneurysm (first degree relatives only).  Tobacco use. PREVENTION  The following healthy lifestyle habits may help decrease your risk of abdominal aortic aneurysm:  Quitting smoking. Smoking can raise your blood pressure and cause arteriosclerosis.  Limiting or avoiding alcohol.  Keeping your blood pressure, blood sugar level, and cholesterol levels within normal limits.  Decreasing your salt intake. In somepeople, too much salt can raise blood pressure and increase your risk of abdominal aortic aneurysm.  Eating a diet low in saturated fats and cholesterol.  Increasing your fiber intake by including  whole grains, vegetables, and fruits in your diet. Eating these foods may help lower blood pressure.  Maintaining a healthy weight.  Staying physically active and exercising regularly. SYMPTOMS  The symptoms of abdominal aortic aneurysm may vary depending on the size and rate of growth of the aneurysm.Most grow slowly and do not have any symptoms. When symptoms do occur, they may include:  Pain (abdomen, side, lower back, or groin). The pain may vary in intensity. A sudden onset of severe pain may indicate that the aneurysm has ruptured.  Feeling full after eating only small amounts of food.  Nausea or vomiting or both.  Feeling a pulsating lump in the abdomen.  Feeling faint or passing out. DIAGNOSIS  Since most unruptured abdominal aortic aneurysms have no symptoms, they are often discovered during diagnostic exams for other conditions. An aneurysm may be found during the following procedures:  Ultrasonography (A one-time screening for abdominal aortic aneurysm by ultrasonography is also recommended for all men aged 65-75 years who have ever smoked).  X-ray exams.  A computed tomography (CT).  Magnetic resonance imaging (MRI).  Angiography or arteriography. TREATMENT  Treatment of an abdominal aortic aneurysm depends on the size of your aneurysm, your age, and risk factors for rupture. Medication to control blood pressure and pain may be used to manage aneurysms smaller than 6 cm. Regular monitoring for enlargement may be recommended by your caregiver if:  The aneurysm is 3-4 cm in size (an annual ultrasonography may be recommended).  The aneurysm is 4-4.5 cm in size (an ultrasonography every 6 months may be recommended).  The aneurysm is larger than 4.5 cm in   size (your caregiver may ask that you be examined by a vascular surgeon). If your aneurysm is larger than 6 cm, surgical repair may be recommended. There are two main methods for repair of an aneurysm:   Endovascular  repair (a minimally invasive surgery). This is done most often.  Open repair. This method is used if an endovascular repair is not possible.   This information is not intended to replace advice given to you by your health care provider. Make sure you discuss any questions you have with your health care provider.   Document Released: 09/08/2005 Document Revised: 03/26/2013 Document Reviewed: 12/29/2012 Elsevier Interactive Patient Education 2016 Elsevier Inc.  

## 2016-04-23 NOTE — Progress Notes (Signed)
[facility] [place]  Established Abdominal Aortic Aneurysm  History of Present Illness  Steven Mccormick is a 81 y.o. (1935-05-14) male patient of Dr. Bridgett Larsson who presents with chief complaint: follow up for AAA. Previous studies, October, 2013, demonstrate an AAA, measuring 4.55 cm. The patient does denies have back or abdominal pain. The patient is a former smoker, quit at age 55.  The patient denies claudication in legs with walking.  The patient denies history of stroke or TIA symptoms.  He states his systolic pressure is usually about 120.  He rarely drinks ETOH, is physically active.  Pt Diabetic: No  Pt smoker: former smoker, quit at age 58  Past Medical History  Diagnosis Date  . Hyperlipidemia   . Hypertension   . GERD (gastroesophageal reflux disease)   . ED (erectile dysfunction)   . Palpitations   . AAA (abdominal aortic aneurysm) [facility])    Past Surgical History  Procedure Laterality Date  . Inguinal hernia repair      Right inguinal hernia  . Hemorroidectomy    . Nephrectomy  1969    Donor for his brother  . Kidney donation  71  . Inguinal hernia repair Left Oct. 17, 2015   Social History Social History   Social History  . Marital Status: Unknown    Spouse Name: N/A  . Number of Children: N/A  . Years of Education: N/A   Occupational History  . Not on file.   Social History Main Topics  . Smoking status: Former Smoker    Quit date: 02/17/1992  . Smokeless tobacco: Never Used  . Alcohol Use: 0.0 oz/week    0 Standard drinks or equivalent per week     Comment: Occasional use  . Drug Use: No  . Sexual Activity: Not on file   Other Topics Concern  . Not on file   Social History Narrative   Family History Family History  Problem Relation Age of Onset  . Cancer Brother     prostate cancer  . Anuerysm Father     abdominal aortic  . Varicose Veins Mother   . Cancer Sister     Lung  . Heart attack Sister     Current  Outpatient Prescriptions on File Prior to Visit  Medication Sig Dispense Refill  . amLODipine (NORVASC) 10 MG tablet Take 10 mg by mouth daily.    Marland Kitchen aspirin 81 MG tablet Take 81 mg by mouth daily.    . bisoprolol-hydrochlorothiazide (ZIAC) 5-6.25 MG per tablet Take 1 tablet by mouth daily.    . fish oil-omega-3 fatty acids 1000 MG capsule Take 1 g by mouth daily.    Marland Kitchen lisinopril (PRINIVIL,ZESTRIL) 10 MG tablet     . ranitidine (ZANTAC) 150 MG tablet Take 150 mg by mouth as needed.     . simvastatin (ZOCOR) 20 MG tablet Take 20 mg by mouth at bedtime.    . vardenafil (LEVITRA) 20 MG tablet Take 20 mg by mouth daily as needed.    . vitamin E 400 UNIT capsule Take 400 Units by mouth daily.     No current facility-administered medications on file prior to visit.   No Known Allergies  ROS: See HPI for pertinent positives and negatives.  Physical Examination  Filed Vitals:   04/23/16 0847  BP: 142/73  Pulse: 56  Temp: 97.2 F (36.2 C)  TempSrc: Oral  Height: 5\' 8"  (1.727 m)  Weight: 143 lb 1.6 oz (64.91 kg)  SpO2: 96%   Body mass index is 21.76 kg/(m^2).  General: A&O x 3, WD.  Pulmonary: Sym exp, good air movt, respirations are non labored, CTAB, no rales, rhonchi, or wheezing.  Cardiac: RRR, Nl S1, S2, no murmur detected.   Carotid Bruits  Left  Right    Negative  Negative   Aorta is palpable.  Radial pulses are 2+ and palpable.   VASCULAR EXAM:  LE Pulses  LEFT  RIGHT   FEMORAL  2+palpable  1+palpable   POPLITEAL  2+palpable  3+palpable   POSTERIOR TIBIAL  2+palpable  2+palpable   DORSALIS PEDIS  ANTERIOR TIBIAL  Not palpable  Not palpable    Gastrointestinal: soft, NTND, -G/R, - HSM, - masses palpated, - CVAT B.  Musculoskeletal: M/S 5/5 throughout, Extremities without ischemic changes.  Neurologic: CN 2-12 intact, Pain and light touch intact in extremities, Motor exam as listed above.                       Non-Invasive Vascular Imaging  AAA Duplex (04/23/2016)  Previous size: 4.54 cm (Date: 10/24/15)  Current size:  4.65 cm (Date: 04/23/16)  Medical Decision Making  The patient is a 81 y.o. male who presents with asymptomatic AAA with no significant increase in size in 6 months.   Based on this patient's exam and diagnostic studies, the patient will follow up in 6 months  with the following studies: AAA duplex.  Consideration for repair of AAA would be made when the size is 5.5 cm, growth > 1 cm/yr, and symptomatic status.  I emphasized the importance of maximal medical management including strict control of blood pressure, blood glucose, and lipid levels, antiplatelet agents, obtaining regular exercise, and continued cessation of smoking.   The patient was given information about AAA including signs, symptoms, treatment, and how to minimize the risk of enlargement and rupture of aneurysms.    The patient was advised to call 911 should the patient experience sudden onset abdominal or back pain.   Thank you for allowing [place] to participate in this patient's care.  Clemon Chambers, RN, MSN, FNP-C [facility] [place] Office: [phone]  Clinic Physician: Bridgett Larsson  04/23/2016, 8:58 AM

## 2016-06-08 DIAGNOSIS — H401132 Primary open-angle glaucoma, bilateral, moderate stage: Secondary | ICD-10-CM | POA: Diagnosis not present

## 2016-06-09 DIAGNOSIS — M1812 Unilateral primary osteoarthritis of first carpometacarpal joint, left hand: Secondary | ICD-10-CM | POA: Diagnosis not present

## 2016-06-09 DIAGNOSIS — M25532 Pain in left wrist: Secondary | ICD-10-CM | POA: Diagnosis not present

## 2016-07-12 DIAGNOSIS — Z125 Encounter for screening for malignant neoplasm of prostate: Secondary | ICD-10-CM | POA: Diagnosis not present

## 2016-07-12 DIAGNOSIS — C61 Malignant neoplasm of prostate: Secondary | ICD-10-CM | POA: Diagnosis not present

## 2016-07-13 ENCOUNTER — Other Ambulatory Visit: Payer: Self-pay | Admitting: *Deleted

## 2016-07-13 DIAGNOSIS — I714 Abdominal aortic aneurysm, without rupture, unspecified: Secondary | ICD-10-CM

## 2016-08-12 DIAGNOSIS — Z85828 Personal history of other malignant neoplasm of skin: Secondary | ICD-10-CM | POA: Diagnosis not present

## 2016-08-12 DIAGNOSIS — L570 Actinic keratosis: Secondary | ICD-10-CM | POA: Diagnosis not present

## 2016-08-12 DIAGNOSIS — L821 Other seborrheic keratosis: Secondary | ICD-10-CM | POA: Diagnosis not present

## 2016-08-12 DIAGNOSIS — D0422 Carcinoma in situ of skin of left ear and external auricular canal: Secondary | ICD-10-CM | POA: Diagnosis not present

## 2016-08-12 DIAGNOSIS — D485 Neoplasm of uncertain behavior of skin: Secondary | ICD-10-CM | POA: Diagnosis not present

## 2016-08-25 DIAGNOSIS — Z6821 Body mass index (BMI) 21.0-21.9, adult: Secondary | ICD-10-CM | POA: Diagnosis not present

## 2016-08-25 DIAGNOSIS — R972 Elevated prostate specific antigen [PSA]: Secondary | ICD-10-CM | POA: Diagnosis not present

## 2016-08-25 DIAGNOSIS — R339 Retention of urine, unspecified: Secondary | ICD-10-CM | POA: Diagnosis not present

## 2016-11-11 ENCOUNTER — Ambulatory Visit: Payer: Medicare Other | Admitting: Family

## 2016-11-11 ENCOUNTER — Other Ambulatory Visit (HOSPITAL_COMMUNITY): Payer: Medicare Other

## 2016-11-15 ENCOUNTER — Encounter: Payer: Self-pay | Admitting: Family

## 2016-11-19 ENCOUNTER — Encounter: Payer: Self-pay | Admitting: Family

## 2016-11-19 ENCOUNTER — Ambulatory Visit (INDEPENDENT_AMBULATORY_CARE_PROVIDER_SITE_OTHER): Payer: Medicare Other | Admitting: Family

## 2016-11-19 ENCOUNTER — Ambulatory Visit (HOSPITAL_COMMUNITY)
Admission: RE | Admit: 2016-11-19 | Discharge: 2016-11-19 | Disposition: A | Discharge status: Home / Self Care | Payer: Medicare Other | Source: Ambulatory Visit | Attending: Family | Admitting: Family

## 2016-11-19 VITALS — BP 151/73 | HR 53 | Temp 97.7°F | Resp 16 | Ht 68.0 in | Wt 141.0 lb

## 2016-11-19 DIAGNOSIS — I714 Abdominal aortic aneurysm, without rupture, unspecified: Secondary | ICD-10-CM

## 2016-11-19 DIAGNOSIS — Z87891 Personal history of nicotine dependence: Secondary | ICD-10-CM | POA: Diagnosis not present

## 2016-11-19 NOTE — Progress Notes (Signed)
[facility] [place]   CC: Follow up Abdominal Aortic Aneurysm  History of Present Illness  Steven Mccormick is a 81 y.o. (April 04, 1935) male patient of Dr. Bridgett Larsson who presents with chief complaint: follow up for AAA. Previous studies, October, 2013, demonstrate an AAA, measuring 4.55 cm. The patient denies back or abdominal pain. The patient is a former smoker, quit at age 89.  The patient denies claudication in legs with walking.  The patient denies history of stroke or TIA symptoms.  He states his systolic pressure is usually about 120.  He rarely drinks ETOH, is physically active.  Pt Diabetic: No  Pt smoker: former smoker, quit at age 66    Past Medical History:  Diagnosis Date  . AAA (abdominal aortic aneurysm) ([place])   . ED (erectile dysfunction)   . GERD (gastroesophageal reflux disease)   . Hyperlipidemia   . Hypertension   . Palpitations    Past Surgical History:  Procedure Laterality Date  . HEMORROIDECTOMY    . INGUINAL HERNIA REPAIR     Right inguinal hernia  . INGUINAL HERNIA REPAIR Left Oct. 17, 2015  . KIDNEY DONATION  1969  . NEPHRECTOMY  1969   Donor for his brother   Social History Social History   Social History  . Marital status: Unknown    Spouse name: N/A  . Number of children: N/A  . Years of education: N/A   Occupational History  . Not on file.   Social History Main Topics  . Smoking status: Former Smoker    Quit date: 02/17/1992  . Smokeless tobacco: Never Used  . Alcohol use 0.0 oz/week     Comment: Occasional use  . Drug use: No  . Sexual activity: Not on file   Other Topics Concern  . Not on file   Social History Narrative  . No narrative on file   Family History Family History  Problem Relation Age of Onset  . Cancer Brother     prostate cancer  . Anuerysm Father     abdominal aortic  . Varicose Veins Mother   . Cancer Sister     Lung  . Heart attack Sister     Current Outpatient Prescriptions  on File Prior to Visit  Medication Sig Dispense Refill  . amLODipine (NORVASC) 10 MG tablet Take 10 mg by mouth daily.    Marland Kitchen aspirin 81 MG tablet Take 81 mg by mouth daily.    . bisoprolol-hydrochlorothiazide (ZIAC) 5-6.25 MG per tablet Take 1 tablet by mouth daily.    . fish oil-omega-3 fatty acids 1000 MG capsule Take 1 g by mouth daily.    Marland Kitchen latanoprost (XALATAN) 0.005 % ophthalmic solution Place 1 drop into both eyes at bedtime.    Marland Kitchen lisinopril (PRINIVIL,ZESTRIL) 10 MG tablet     . ranitidine (ZANTAC) 150 MG tablet Take 150 mg by mouth as needed.     . simvastatin (ZOCOR) 20 MG tablet Take 20 mg by mouth at bedtime.    . vitamin E 400 UNIT capsule Take 400 Units by mouth daily.    . vardenafil (LEVITRA) 20 MG tablet Take 20 mg by mouth daily as needed.     No current facility-administered medications on file prior to visit.    No Known Allergies  ROS: See HPI for pertinent positives and negatives.  Physical Examination  Vitals:   11/19/16 0905 11/19/16 0908  BP: (!) 150/72 (!) 151/73  Pulse: (!) 53   Resp:  16   Temp: 97.7 F (36.5 C)   TempSrc: Oral   SpO2: 99%   Weight: 141 lb (64 kg)   Height: 5\' 8"  (1.727 m)    Body mass index is 21.44 kg/m.  General: A&O x 3, WD.  Pulmonary: Sym exp, good air movt, respirations are non labored, CTAB, no rales, rhonchi, or wheezing.  Cardiac: RRR, Nl S1, S2, no murmur detected.   Carotid Bruits  Left  Right    Negative  Negative   Aorta is palpable.  Radial pulses are 2+ and palpable.   VASCULAR EXAM:  LE Pulses  LEFT  RIGHT   FEMORAL  2+palpable  1+palpable   POPLITEAL  2+palpable  3+palpable   POSTERIOR TIBIAL  2+palpable  2+palpable   DORSALIS PEDIS  ANTERIOR TIBIAL  Not palpable  Not palpable    Gastrointestinal: soft, NTND, -G/R, - HSM, - masses palpated, - CVAT B.  Musculoskeletal: M/S 5/5 throughout, Extremities without ischemic changes.   Neurologic: CN 2-12 intact, Pain and light touch intact in extremities, Motor exam as listed above.   Non-Invasive Vascular Imaging  AAA Duplex (11/19/2016)  Previous size:4.65 cm (Date: 04/23/16)  Current size:  4.58 cm (Date: 11-19-16) Right CIA: 1.36 cm; left CIA: 1.13 cm  Medical Decision Making  The patient is a 81 y.o. male who presents with asymptomatic AAA with no increase in size.  Prominent popliteal pulses: 10-24-15 duplex of popliteal arteries demonstrated no evidence of popliteal artery aneurysms.    Based on this patient's exam and diagnostic studies, the patient will follow up in 6 months with the following studies: AAA duplex.  Consideration for repair of AAA would be made when the size is 5.0 cm, growth > 1 cm/yr, and symptomatic status.  I emphasized the importance of maximal medical management including strict control of blood pressure, blood glucose, and lipid levels, antiplatelet agents, obtaining regular exercise, and continued  cessation of smoking.   The patient was given information about AAA including signs, symptoms, treatment, and how to minimize the risk of enlargement and rupture of aneurysms.    The patient was advised to call 911 should the patient experience sudden onset abdominal or back pain.   Thank you for allowing [place] to participate in this patient's care.  Clemon Chambers, RN, MSN, FNP-C [facility] [place] Office: [phone]  Clinic Physician: Bridgett Larsson  11/19/2016, 9:14 AM

## 2016-11-19 NOTE — Patient Instructions (Signed)
Abdominal Aortic Aneurysm Blood pumps away from the heart through tubes (blood vessels) called arteries. Aneurysms are weak or damaged places in the wall of an artery. It bulges out like a balloon. An abdominal aortic aneurysm happens in the main artery of the body (aorta). It can burst or tear, causing bleeding inside the body. This is an emergency. It needs treatment right away. What are the causes? The exact cause is unknown. Things that could cause this problem include:  Fat and other substances building up in the lining of a tube.  Swelling of the walls of a blood vessel.  Certain tissue diseases.  Belly (abdominal) trauma.  An infection in the main artery of the body.  What increases the risk? There are things that make it more likely for you to have an aneurysm. These include:  Being over the age of 79 years old.  Having high blood pressure (hypertension).  Being a male.  Being white.  Being very overweight (obese).  Having a family history of aneurysm.  Using tobacco products.  What are the signs or symptoms? Symptoms depend on the size of the aneurysm and how fast it grows. There may not be symptoms. If symptoms occur, they can include:  Pain (belly, side, lower back, or groin).  Feeling full after eating a small amount of food.  Feeling sick to your stomach (nauseous), throwing up (vomiting), or both.  Feeling a lump in your belly that feels like it is beating (pulsating).  Feeling like you will pass out (faint).  How is this treated?  Medicine to control blood pressure and pain.  Imaging tests to see if the aneurysm gets bigger.  Surgery. How is this prevented? To lessen your chance of getting this condition:  Stop smoking. Stop chewing tobacco.  Limit or avoid alcohol.  Keep your blood pressure, blood sugar, and cholesterol within normal limits.  Eat less salt.  Eat foods low in saturated fats and cholesterol. These are found in animal and  whole dairy products.  Eat more fiber. Fiber is found in whole grains, vegetables, and fruits.  Keep a healthy weight.  Stay active and exercise often.  This information is not intended to replace advice given to you by your health care provider. Make sure you discuss any questions you have with your health care provider. Document Released: 03/26/2013 Document Revised: 05/06/2016 Document Reviewed: 12/29/2012 Elsevier Interactive Patient Education  2017 Elsevier Inc.  

## 2016-11-22 DIAGNOSIS — Z85828 Personal history of other malignant neoplasm of skin: Secondary | ICD-10-CM | POA: Diagnosis not present

## 2016-11-22 DIAGNOSIS — C44329 Squamous cell carcinoma of skin of other parts of face: Secondary | ICD-10-CM | POA: Diagnosis not present

## 2016-11-22 DIAGNOSIS — L570 Actinic keratosis: Secondary | ICD-10-CM | POA: Diagnosis not present

## 2016-11-22 DIAGNOSIS — D485 Neoplasm of uncertain behavior of skin: Secondary | ICD-10-CM | POA: Diagnosis not present

## 2016-12-14 DIAGNOSIS — C44329 Squamous cell carcinoma of skin of other parts of face: Secondary | ICD-10-CM | POA: Diagnosis not present

## 2016-12-14 DIAGNOSIS — Z85828 Personal history of other malignant neoplasm of skin: Secondary | ICD-10-CM | POA: Diagnosis not present

## 2016-12-22 DIAGNOSIS — H2513 Age-related nuclear cataract, bilateral: Secondary | ICD-10-CM | POA: Diagnosis not present

## 2016-12-22 DIAGNOSIS — H401132 Primary open-angle glaucoma, bilateral, moderate stage: Secondary | ICD-10-CM | POA: Diagnosis not present

## 2017-02-01 DIAGNOSIS — I10 Essential (primary) hypertension: Secondary | ICD-10-CM | POA: Diagnosis not present

## 2017-02-01 DIAGNOSIS — Z79899 Other long term (current) drug therapy: Secondary | ICD-10-CM | POA: Diagnosis not present

## 2017-02-01 DIAGNOSIS — R739 Hyperglycemia, unspecified: Secondary | ICD-10-CM | POA: Diagnosis not present

## 2017-02-01 DIAGNOSIS — K219 Gastro-esophageal reflux disease without esophagitis: Secondary | ICD-10-CM | POA: Diagnosis not present

## 2017-02-01 DIAGNOSIS — Z0000 Encounter for general adult medical examination without abnormal findings: Secondary | ICD-10-CM | POA: Diagnosis not present

## 2017-02-01 DIAGNOSIS — Z23 Encounter for immunization: Secondary | ICD-10-CM | POA: Diagnosis not present

## 2017-02-01 DIAGNOSIS — E785 Hyperlipidemia, unspecified: Secondary | ICD-10-CM | POA: Diagnosis not present

## 2017-03-24 DIAGNOSIS — Z85828 Personal history of other malignant neoplasm of skin: Secondary | ICD-10-CM | POA: Diagnosis not present

## 2017-03-24 DIAGNOSIS — L308 Other specified dermatitis: Secondary | ICD-10-CM | POA: Diagnosis not present

## 2017-03-24 DIAGNOSIS — L570 Actinic keratosis: Secondary | ICD-10-CM | POA: Diagnosis not present

## 2017-04-07 DIAGNOSIS — C61 Malignant neoplasm of prostate: Secondary | ICD-10-CM | POA: Diagnosis not present

## 2017-04-07 DIAGNOSIS — Z125 Encounter for screening for malignant neoplasm of prostate: Secondary | ICD-10-CM | POA: Diagnosis not present

## 2017-04-12 DIAGNOSIS — R972 Elevated prostate specific antigen [PSA]: Secondary | ICD-10-CM | POA: Diagnosis not present

## 2017-04-12 DIAGNOSIS — R339 Retention of urine, unspecified: Secondary | ICD-10-CM | POA: Diagnosis not present

## 2017-04-12 DIAGNOSIS — Z6821 Body mass index (BMI) 21.0-21.9, adult: Secondary | ICD-10-CM | POA: Diagnosis not present

## 2017-05-23 ENCOUNTER — Encounter: Payer: Self-pay | Admitting: Family

## 2017-06-03 ENCOUNTER — Encounter: Payer: Self-pay | Admitting: Family

## 2017-06-03 ENCOUNTER — Ambulatory Visit (HOSPITAL_COMMUNITY)
Admission: RE | Admit: 2017-06-03 | Discharge: 2017-06-03 | Disposition: A | Discharge status: Home / Self Care | Payer: Medicare Other | Source: Ambulatory Visit | Attending: Vascular Surgery | Admitting: Vascular Surgery

## 2017-06-03 ENCOUNTER — Ambulatory Visit (INDEPENDENT_AMBULATORY_CARE_PROVIDER_SITE_OTHER): Payer: Medicare Other | Admitting: Family

## 2017-06-03 VITALS — BP 112/62 | HR 50 | Temp 97.3°F | Resp 16 | Ht 68.0 in | Wt 133.0 lb

## 2017-06-03 DIAGNOSIS — Z87891 Personal history of nicotine dependence: Secondary | ICD-10-CM

## 2017-06-03 DIAGNOSIS — I714 Abdominal aortic aneurysm, without rupture, unspecified: Secondary | ICD-10-CM

## 2017-06-03 DIAGNOSIS — R0989 Other specified symptoms and signs involving the circulatory and respiratory systems: Secondary | ICD-10-CM | POA: Diagnosis not present

## 2017-06-03 NOTE — Progress Notes (Signed)
[facility] [place]   CC: Follow up Abdominal Aortic Aneurysm  History of Present Illness  Steven Mccormick is a 82 y.o. (10/07/1935) male patient of Dr. Bridgett Larsson who presents with chief complaint: follow up for AAA. Previous studies, October, 2013, demonstrate an AAA, measuring 4.55 cm. The patient denies back or abdominal pain. The patient is a former smoker, quit at age 44.  The patient denies claudication in legs with walking.  The patient denies history of stroke or TIA symptoms.   He rarely drinks ETOH, is physically active.  He takes a daily ASA and statin.  Pt Diabetic: No Pt smoker: former smoker, quit at age 29   Past Medical History:  Diagnosis Date  . AAA (abdominal aortic aneurysm) ([place])   . ED (erectile dysfunction)   . GERD (gastroesophageal reflux disease)   . Hyperlipidemia   . Hypertension   . Palpitations    Past Surgical History:  Procedure Laterality Date  . HEMORROIDECTOMY    . INGUINAL HERNIA REPAIR     Right inguinal hernia  . INGUINAL HERNIA REPAIR Left Oct. 17, 2015  . KIDNEY DONATION  1969  . NEPHRECTOMY  1969   Donor for his brother   Social History Social History   Social History  . Marital status: Unknown    Spouse name: N/A  . Number of children: N/A  . Years of education: N/A   Occupational History  . Not on file.   Social History Main Topics  . Smoking status: Former Smoker    Quit date: 02/17/1992  . Smokeless tobacco: Never Used  . Alcohol use 0.0 oz/week     Comment: Occasional use  . Drug use: No  . Sexual activity: Not on file   Other Topics Concern  . Not on file   Social History Narrative  . No narrative on file   Family History Family History  Problem Relation Age of Onset  . Cancer Brother        prostate cancer  . Anuerysm Father        abdominal aortic  . Varicose Veins Mother   . Cancer Sister        Lung  . Heart attack Sister     Current Outpatient Prescriptions on File Prior  to Visit  Medication Sig Dispense Refill  . amLODipine (NORVASC) 10 MG tablet Take 10 mg by mouth daily.    Marland Kitchen aspirin 81 MG tablet Take 81 mg by mouth daily.    . bisoprolol-hydrochlorothiazide (ZIAC) 5-6.25 MG per tablet Take 1 tablet by mouth daily.    . fish oil-omega-3 fatty acids 1000 MG capsule Take 1 g by mouth daily.    Marland Kitchen latanoprost (XALATAN) 0.005 % ophthalmic solution Place 1 drop into both eyes at bedtime.    Marland Kitchen lisinopril (PRINIVIL,ZESTRIL) 10 MG tablet     . ranitidine (ZANTAC) 150 MG tablet Take 150 mg by mouth as needed.     . simvastatin (ZOCOR) 20 MG tablet Take 20 mg by mouth at bedtime.    . vardenafil (LEVITRA) 20 MG tablet Take 20 mg by mouth daily as needed.    . vitamin E 400 UNIT capsule Take 400 Units by mouth daily.     No current facility-administered medications on file prior to visit.    No Known Allergies  ROS: See HPI for pertinent positives and negatives.  Physical Examination  Vitals:   06/03/17 0834  BP: 112/62  Pulse: (!) 50  Resp:  16  Temp: 97.3 F (36.3 C)  TempSrc: Oral  SpO2: 99%  Weight: 133 lb (60.3 kg)  Height: 5\' 8"  (1.727 m)   Body mass index is 20.22 kg/m.  General: A&O x 3, WD.  Pulmonary: Sym exp, good air movt, respirations are non labored, CTAB, no rales, rhonchi, or wheezing.  Cardiac: Regular rhythm, bradycardic on a beta blocker, Nl S1, S2, no murmur detected.   Carotid Bruits Left Right   Negative  Negative    Abdominal aortic pulse is palpable.  Radial pulses are 2+ and palpable.   VASCULAR EXAM: LE Pulses  LEFT  RIGHT   FEMORAL  2+palpable 1+palpable   POPLITEAL  2+palpable  3+palpable  POSTERIOR TIBIAL  2+palpable  2+palpable   DORSALIS PEDIS ANTERIOR TIBIAL  Not palpable  Not palpable    Gastrointestinal: soft, NTND, -G/R, - HSM, - masses palpated, - CVAT B.  Musculoskeletal: M/S 5/5 throughout, Extremities without ischemic  changes.  Neurologic: CN 2-12 intact, Pain and light touch intact in extremities, Motor exam as listed above.  03-24-12 CTA abd/pelvis: 1.  Fusiform infrarenal abdominal aortic aneurysm measures  4.4 x 4.2 cm in greatest true luminal diameter.  The aneurysm originates approximately 3.4 cm caudal to the solitary remaining right-sided renal artery and extends to the level of the aortic bifurcation. The aneurysm is largely free of mural thrombus.  The IMA remains patent. 2.  At least 50% luminal narrowing of the origin of the right common iliac artery.  The left common iliac arteries noted be tortuous but free of hemodynamically significant narrowing.  Non-Invasive Vascular Imaging  AAA Duplex (06/03/2017)  Previous size:4.58 cm (Date: 11-19-16) Right CIA: 1.36 cm; left CIA: 1.13 cm   Current size:  5.27 cm (Date: 06/03/17), Right CIA: 1.25 cm; Left CIA: 1.13 cm  Medical Decision Making  The patient is a 82 y.o. male who presents with asymptomatic AAA with increase in size, . Prominent popliteal pulses: 10-24-15 duplex of popliteal arteries demonstrated no evidence of popliteal artery aneurysms.    Based on this patient's exam and diagnostic studies, the patient will follow up in 2-3 weeks  with the following studies: CTA abd/pelvis, see Dr. Bridgett Larsson afterward.  Consideration for repair of AAA would be made when the size is 5.5 cm, growth > 1 cm/yr, and symptomatic status.        The patient was given information about AAA including signs, symptoms, treatment, and how to minimize the risk of enlargement and rupture of aneurysms.    I emphasized the importance of maximal medical management including strict control of blood pressure, blood glucose, and lipid levels, antiplatelet agents, obtaining regular exercise, and continued cessation of smoking.   The patient was advised to call 911 should the patient experience sudden onset abdominal or back pain.   Thank you for allowing [place] to  participate in this patient's care.  Clemon Chambers, RN, MSN, FNP-C [facility] [place] Office: [phone]  Clinic Physician: Donzetta Matters  06/03/2017, 8:45 AM

## 2017-06-03 NOTE — Patient Instructions (Signed)
Abdominal Aortic Aneurysm Blood pumps away from the heart through tubes (blood vessels) called arteries. Aneurysms are weak or damaged places in the wall of an artery. It bulges out like a balloon. An abdominal aortic aneurysm happens in the main artery of the body (aorta). It can burst or tear, causing bleeding inside the body. This is an emergency. It needs treatment right away. What are the causes? The exact cause is unknown. Things that could cause this problem include:  Fat and other substances building up in the lining of a tube.  Swelling of the walls of a blood vessel.  Certain tissue diseases.  Belly (abdominal) trauma.  An infection in the main artery of the body.  What increases the risk? There are things that make it more likely for you to have an aneurysm. These include:  Being over the age of 80 years old.  Having high blood pressure (hypertension).  Being a male.  Being white.  Being very overweight (obese).  Having a family history of aneurysm.  Using tobacco products.  What are the signs or symptoms? Symptoms depend on the size of the aneurysm and how fast it grows. There may not be symptoms. If symptoms occur, they can include:  Pain (belly, side, lower back, or groin).  Feeling full after eating a small amount of food.  Feeling sick to your stomach (nauseous), throwing up (vomiting), or both.  Feeling a lump in your belly that feels like it is beating (pulsating).  Feeling like you will pass out (faint).  How is this treated?  Medicine to control blood pressure and pain.  Imaging tests to see if the aneurysm gets bigger.  Surgery. How is this prevented? To lessen your chance of getting this condition:  Stop smoking. Stop chewing tobacco.  Limit or avoid alcohol.  Keep your blood pressure, blood sugar, and cholesterol within normal limits.  Eat less salt.  Eat foods low in saturated fats and cholesterol. These are found in animal and  whole dairy products.  Eat more fiber. Fiber is found in whole grains, vegetables, and fruits.  Keep a healthy weight.  Stay active and exercise often.  This information is not intended to replace advice given to you by your health care provider. Make sure you discuss any questions you have with your health care provider. Document Released: 03/26/2013 Document Revised: 05/06/2016 Document Reviewed: 12/29/2012 Elsevier Interactive Patient Education  2017 Elsevier Inc.  

## 2017-06-03 NOTE — Addendum Note (Signed)
Addended by: Lianne Cure A on: 06/03/2017 09:18 AM   Modules accepted: Orders

## 2017-06-13 ENCOUNTER — Encounter: Payer: Self-pay | Admitting: Vascular Surgery

## 2017-06-20 DIAGNOSIS — H401132 Primary open-angle glaucoma, bilateral, moderate stage: Secondary | ICD-10-CM | POA: Diagnosis not present

## 2017-06-20 DIAGNOSIS — H2513 Age-related nuclear cataract, bilateral: Secondary | ICD-10-CM | POA: Diagnosis not present

## 2017-06-20 NOTE — Progress Notes (Signed)
Established Abdominal Aortic Aneurysm   History of Present Illness   The patient is a 82 y.o. (07-Sep-1935) male who presents with chief complaint: follow up for AAA.  Previous studies demonstrate an AAA, measuring 5.3 cm.  The patient does not have back or abdominal pain.  The patient is not a smoker: quit at 82 y/o  The patient's PMH, PSH, SH, and FamHx are unchanged from 06/03/17.  Current Outpatient Prescriptions  Medication Sig Dispense Refill  . amLODipine (NORVASC) 10 MG tablet Take 10 mg by mouth daily.    Marland Kitchen aspirin 81 MG tablet Take 81 mg by mouth daily.    . bisoprolol-hydrochlorothiazide (ZIAC) 5-6.25 MG per tablet Take 1 tablet by mouth daily.    . fish oil-omega-3 fatty acids 1000 MG capsule Take 1 g by mouth daily.    Marland Kitchen latanoprost (XALATAN) 0.005 % ophthalmic solution Place 1 drop into both eyes at bedtime.    Marland Kitchen lisinopril (PRINIVIL,ZESTRIL) 10 MG tablet     . ranitidine (ZANTAC) 150 MG tablet Take 150 mg by mouth as needed.     . simvastatin (ZOCOR) 20 MG tablet Take 20 mg by mouth at bedtime.    . vardenafil (LEVITRA) 20 MG tablet Take 20 mg by mouth daily as needed.    . vitamin E 400 UNIT capsule Take 400 Units by mouth daily.     No current facility-administered medications for this visit.     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  General Alert, O x 3, WD, NAD  Pulmonary Sym exp, good B air movt, CTA B  Cardiac RRR, Nl S1, S2, no Murmurs, No rubs, No S3,S4  Vascular Vessel Right Left  Radial Palpable Palpable  Brachial Palpable Palpable  Carotid Palpable, No Bruit Palpable, No Bruit  Aorta Not palpable N/A  Femoral Palpable Palpable  Popliteal Not palpable Not palpable  PT Faintly palpable Faintly palpable  DP  Faintly palpable Faintly palpable    Gastro- intestinal soft, non-distended, non-tender to palpation, No guarding or rebound, no HSM, no masses, no CVAT B, No palpable prominent aortic pulse,    Musculo- skeletal M/S 5/5 throughout  , Extremities without ischemic changes  , No edema present, No visible varicosities , No Lipodermatosclerosis present  Neurologic Pain and light touch intact in extremities , Motor exam as listed above    Radiology     Ct Angio Abdomen Pelvis  W &/or Wo Contrast  Result Date: 06/22/2017 CLINICAL DATA:  Abdominal aortic aneurysm, weight loss, prior left nephrectomy EXAM: CT ANGIOGRAPHY ABDOMEN AND PELVIS TECHNIQUE: Multidetector CT imaging of the abdomen and pelvis was performed using the standard protocol during bolus administration of intravenous contrast. Multiplanar reconstructed images including MIPs were obtained and reviewed to evaluate the vascular anatomy. CONTRAST:  75 cc Isovue 370 COMPARISON:  03/24/2012 FINDINGS: Arterial findings: Aorta: Fusiform atherosclerotic infrarenal abdominal aortic aneurysm measures 5.4 cm transverse and 4.8 cm in AP dimension, previously measuring 4.2 x 4.4 cm at a similar level. Distal aspect of the aneurysm is tortuous. No associated dissection or retroperitoneal hemorrhage. No evidence of rupture. Celiac axis: Tortuous atherosclerotic origin but remains patent. Splenic, left gastric, and  hepatic branches are patent. Superior mesenteric:  Mild atherosclerotic change but widely patent. Left renal: Not visualized. Patient is status post previous left nephrectomy Right renal: Atherosclerotic origin but remains patent. No accessory right renal artery. Inferior mesenteric: Origin remains patent off of the infrarenal aneurysm laterally, image 94 series 4. Left iliac: Tortuous aortoiliac bifurcation. Atherosclerotic changes of the iliac system. Left common, internal and external iliac arteries remain patent. Right iliac: Torturous aortic  iliac bifurcation. Right common, internal and external iliac arteries remain patent. Venous findings:      Dedicated venous imaging not performed Review of the MIP images confirms the above findings. Nonvascular findings: Lower chest: Minor dependent bibasilar atelectasis. Normal heart size. No pericardial pleural effusion. Liver, gallbladder, biliary system, pancreas, and spleen are within normal limits for arterial phase imaging. Stable mild nodularity and thickening of the adrenal glands. Previous left nephrectomy. Right kidney demonstrates mild hypertrophy without acute process or obstruction. No focal right renal abnormality. Right ureter is not dilated. No obstructing ureteral calculus. Urinary bladder unremarkable. Negative for bowel obstruction, significant dilatation, ileus, or free air. Normal appendix demonstrated. No fluid collection or abscess. Colonic diverticulosis noted. No acute inflammatory process. No adenopathy. Prostate gland is enlarged impressing upon the bladder base. No significant inguinal abnormality. Intact abdominal wall. No hernia. Degenerative changes of the spine. Mild associated scoliosis. No acute osseous finding. IMPRESSION: 5.4 cm fusiform infrarenal abdominal aortic aneurysm, enlarged since comparison study of 03/24/2012 previously measuring 4.4 cm. Recommend followup by abdomen and pelvis CTA in 3-6 months, and vascular surgery referral/consultation if not already obtained. This recommendation follows ACR consensus guidelines: White Paper of the ACR Incidental Findings Committee II on Vascular Findings. J Am Coll Radiol 2013; 10:789-794. Remote left nephrectomy. Diverticulosis without acute inflammatory process Prostate enlargement Electronically Signed   By: Jerilynn Mages.  Shick M.D.   On: 06/22/2017 09:09    Medical Decision Making   TAGGART PRASAD is a 82 y.o. (Aug 18, 1935) male who presents with: asymptomatic large AAA with increasing size.   Based on this patient's exam and  diagnostic studies, he needs EVAR.  As part of his preop evaluation, I am going to refer him to Cardiology for preop risk stratification as I suspect Anes will request such.  I emphasized the importance of maximal medical management including strict control of blood pressure, blood glucose, and lipid levels, antiplatelet agents, obtaining regular exercise, and cessation of smoking.    I will have him follow up in 2 weeks for procedure counseling.  Thank you for allowing [place] to participate in this patient's care.   Adele Barthel, MD, FACS [facility] [place] Office: [phone] Pager: [phone]

## 2017-06-22 ENCOUNTER — Ambulatory Visit (INDEPENDENT_AMBULATORY_CARE_PROVIDER_SITE_OTHER): Payer: Medicare Other | Admitting: Vascular Surgery

## 2017-06-22 ENCOUNTER — Encounter: Payer: Self-pay | Admitting: Vascular Surgery

## 2017-06-22 ENCOUNTER — Ambulatory Visit
Admission: RE | Admit: 2017-06-22 | Discharge: 2017-06-22 | Disposition: A | Discharge status: Home / Self Care | Payer: Medicare Other | Source: Ambulatory Visit | Attending: Family | Admitting: Family

## 2017-06-22 VITALS — BP 140/73 | HR 61 | Temp 97.1°F | Resp 16 | Ht 68.0 in | Wt 134.0 lb

## 2017-06-22 DIAGNOSIS — I714 Abdominal aortic aneurysm, without rupture, unspecified: Secondary | ICD-10-CM

## 2017-06-22 MED ORDER — IOPAMIDOL (ISOVUE-370) INJECTION 76%
75.0000 mL | Freq: Once | INTRAVENOUS | Status: AC | PRN
  Administered 2017-06-22: 75 mL via INTRAVENOUS

## 2017-06-24 ENCOUNTER — Telehealth: Payer: Self-pay | Admitting: Vascular Surgery

## 2017-06-24 NOTE — Telephone Encounter (Signed)
Spoke to patient's wife regarding the cardiology appt w/ Dr.Meng scheduled for 07/06/17 at 1:30pm at the [place] location [address] of [place]. Their phone number is [phone]. And also the appt here w/ Dr.Chen at 8:30 on 07/22/17 to discuss EVAR procedure per blue d/c sheet on 06/22/17. awt

## 2017-06-27 ENCOUNTER — Other Ambulatory Visit: Payer: Self-pay

## 2017-07-06 ENCOUNTER — Encounter: Payer: Self-pay | Admitting: Physician Assistant

## 2017-07-06 ENCOUNTER — Ambulatory Visit (INDEPENDENT_AMBULATORY_CARE_PROVIDER_SITE_OTHER): Payer: Medicare Other | Admitting: Physician Assistant

## 2017-07-06 VITALS — BP 130/62 | HR 52 | Ht 68.0 in | Wt 136.0 lb

## 2017-07-06 DIAGNOSIS — I2510 Atherosclerotic heart disease of native coronary artery without angina pectoris: Secondary | ICD-10-CM

## 2017-07-06 DIAGNOSIS — Z8679 Personal history of other diseases of the circulatory system: Secondary | ICD-10-CM | POA: Diagnosis not present

## 2017-07-06 DIAGNOSIS — E785 Hyperlipidemia, unspecified: Secondary | ICD-10-CM | POA: Diagnosis not present

## 2017-07-06 DIAGNOSIS — Z8249 Family history of ischemic heart disease and other diseases of the circulatory system: Secondary | ICD-10-CM

## 2017-07-06 DIAGNOSIS — I2584 Coronary atherosclerosis due to calcified coronary lesion: Secondary | ICD-10-CM

## 2017-07-06 DIAGNOSIS — Z01810 Encounter for preprocedural cardiovascular examination: Secondary | ICD-10-CM

## 2017-07-06 DIAGNOSIS — I714 Abdominal aortic aneurysm, without rupture, unspecified: Secondary | ICD-10-CM

## 2017-07-06 DIAGNOSIS — I7090 Unspecified atherosclerosis: Secondary | ICD-10-CM

## 2017-07-06 DIAGNOSIS — Z9189 Other specified personal risk factors, not elsewhere classified: Secondary | ICD-10-CM | POA: Diagnosis not present

## 2017-07-06 DIAGNOSIS — I700 Atherosclerosis of aorta: Secondary | ICD-10-CM

## 2017-07-06 DIAGNOSIS — I10 Essential (primary) hypertension: Secondary | ICD-10-CM

## 2017-07-06 DIAGNOSIS — I708 Atherosclerosis of other arteries: Secondary | ICD-10-CM

## 2017-07-06 NOTE — Progress Notes (Signed)
Cardiology Office Note    Date:  07/07/2017   ID:  Steven Mccormick, DOB 11/20/1935, MRN [id number]  PCP:  Street, Steven Mt, MD  Cardiologist:  Jeanmarie Hubert discussed with Dr. Gwenlyn Mccormick DOD  Chief Complaint  Patient presents with  . New Evaluation  . Pre-op Exam    preop clearance requested by Dr. Bridgett Larsson for EVAR of AAA    History of Present Illness:  Steven Mccormick is a 82 y.o. male with PMH of HTN, HLD, AAA and h/o palpitation. He has been followed by Dr. Adele Barthel with [facility] for AAA.  AAA duplex obtained on 06/03/2017 showed dilated aneurysm measuring up to 5.2 cm. CT angiogram of abdomen and pelvis obtained on 06/22/2017 showed fusiform atherosclerotic infrarenal abdominal aortic aneurysm measuring 5.4 cm transverse and 4.8 cm in AP dimension, this has increased compared to previous 4.2 x 4.4 cm at similar levels. Dr. Vallarie Mccormick recommended EVAR, he was referred here for preoperative clearance.  He works on a farm, he does have family history of coronary artery disease with his sister having MI around age 55. He become more short of breath with extreme exertion however would be able to tolerate light walking. He denies any recent chest discomfort. Review of the previous CT of chest abdomen and pelvis showed diffuse arterial calcification. Otherwise, he is largely asymptomatic. I did discussed the case with DOD Dr. Quay Burow who recommended a stress test prior to cardiac clearance given his cardiac risk factors including advanced age, hypertension, hyperlipidemia, known arterial calcification and family history.   Past Medical History:  Diagnosis Date  . AAA (abdominal aortic aneurysm) ([place])   . ED (erectile dysfunction)   . GERD (gastroesophageal reflux disease)   . Hyperlipidemia   . Hypertension   . Palpitations     Past Surgical History:  Procedure Laterality Date  . HEMORROIDECTOMY    . INGUINAL HERNIA REPAIR     Right inguinal hernia  . INGUINAL HERNIA REPAIR Left Oct. 17, 2015  .  KIDNEY DONATION  1969  . NEPHRECTOMY  1969   Donor for his brother    Current Medications: Outpatient Medications Prior to Visit  Medication Sig Dispense Refill  . amLODipine (NORVASC) 10 MG tablet Take 10 mg by mouth daily.    Marland Kitchen aspirin 81 MG tablet Take 81 mg by mouth daily.    . bisoprolol-hydrochlorothiazide (ZIAC) 5-6.25 MG per tablet Take 1 tablet by mouth daily.    . Cyanocobalamin (B-12) 5000 MCG CAPS Take 5,000 mcg by mouth daily.    Marland Kitchen latanoprost (XALATAN) 0.005 % ophthalmic solution Place 1 drop into both eyes at bedtime.    Marland Kitchen lisinopril (PRINIVIL,ZESTRIL) 10 MG tablet Take 10 mg by mouth daily.     . ranitidine (ZANTAC) 150 MG tablet Take 150 mg by mouth daily as needed for heartburn.     . simvastatin (ZOCOR) 20 MG tablet Take 20 mg by mouth daily.     . tamsulosin (FLOMAX) 0.4 MG CAPS capsule Take 0.4 mg by mouth at bedtime.    . vitamin E 400 UNIT capsule Take 400 Units by mouth daily.     No facility-administered medications prior to visit.      Allergies:   Patient has no known allergies.   Social History   Social History  . Marital status: Unknown    Spouse name: N/A  . Number of children: N/A  . Years of education: N/A   Social History Main Topics  . Smoking status:  Former Smoker    Quit date: 02/17/1992  . Smokeless tobacco: Never Used  . Alcohol use 0.0 oz/week     Comment: Occasional use  . Drug use: No  . Sexual activity: Not Asked   Other Topics Concern  . None   Social History Narrative  . None     Family History:  The patient's family history includes Anuerysm in his father; Cancer in his brother and sister; Heart attack in his sister; Varicose Veins in his mother.   ROS:   Please see the history of present illness.    ROS All other systems reviewed and are negative.   PHYSICAL EXAM:   VS:  BP 130/62   Pulse (!) 52   Ht 5\' 8"  (1.727 m)   Wt 136 lb (61.7 kg)   SpO2 98%   BMI 20.68 kg/m    GEN: Well nourished, well developed, in no  acute distress  HEENT: normal  Neck: no JVD, carotid bruits, or masses Cardiac: RRR; no murmurs, rubs, or gallops,no edema  Respiratory:  clear to auscultation bilaterally, normal work of breathing GI: soft, nontender, nondistended, + BS MS: no deformity or atrophy  Skin: warm and dry, no rash Neuro:  Alert and Oriented x 3, Strength and sensation are intact Psych: euthymic mood, full affect  Wt Readings from Last 3 Encounters:  07/06/17 136 lb (61.7 kg)  06/22/17 134 lb (60.8 kg)  06/03/17 133 lb (60.3 kg)      Studies/Labs Reviewed:   EKG:  EKG is ordered today.  The ekg ordered today demonstrates Sinus bradycardia, heart rate 52, no significant ST-T wave changes  Recent Labs: No results Mccormick for requested labs within last 8760 hours.   Lipid Panel No results Mccormick for: CHOL, TRIG, HDL, CHOLHDL, VLDL, LDLCALC, LDLDIRECT  Additional studies/ records that were reviewed today include:   AAA duplex 06/03/2017       ASSESSMENT:    1. Preprocedural cardiovascular examination   2. AAA (abdominal aortic aneurysm) without rupture ([place])   3. History of palpitations in adulthood   4. Hyperlipidemia LDL goal <70   5. Cardiovascular risk factor   6. Arterial atherosclerosis   7. Aortic atherosclerosis ([place])   8. Family history of coronary artery disease   9. Hypertension, unspecified type   10. Coronary artery calcification      PLAN:  In order of problems listed above:  1. Preoperative clearance prior to EVAR: Although he does have dyspnea on exertion with extreme exercise, I think this is likely appropriate for his age group. What I am more concerned about is he has diffuse arterial calcification on CT of chest and abdomen dating back to 2013. He has family history of CAD. Other cardiovascular risk factors including age, hyperlipidemia and hypertension. I have discussed the case with DOD Dr. Gwenlyn Mccormick who recommended stress test before clearance.  2. AAA: Pending upcoming  EVAR  3. Hypertension: Blood pressure well-controlled. Baseline heart rate bradycardic on bisoprolol  4. Hyperlipidemia: On Zocor 20 mg daily.    Medication Adjustments/Labs and Tests Ordered: Current medicines are reviewed at length with the patient today.  Concerns regarding medicines are outlined above.  Medication changes, Labs and Tests ordered today are listed in the Patient Instructions below. Patient Instructions  Medication Instructions:   No changes  Labwork:   none  Testing/Procedures:  Your physician has requested that you have a lexiscan myoview. For further information please visit [email or web address]. Please follow instruction sheet, as given.  This  has been scheduled at 7:45am at our [facility] office, Friday July 27th.  Follow-Up:  We will call you with outcome of stress test - if normal will clear you for surgery and send this information to surgeon. Plan to follow up for routine office visit in 6 months, with Dr. Gwenlyn Mccormick.     If you need a refill on your cardiac medications before your next appointment, please call your pharmacy.      Hilbert Corrigan, [place]  07/07/2017 4:50 PM    [facility] [place], [place], [place]  [address] Phone: [phone]; Fax: [phone]

## 2017-07-06 NOTE — Patient Instructions (Signed)
Medication Instructions:   No changes  Labwork:   none  Testing/Procedures:  Your physician has requested that you have a lexiscan myoview. For further information please visit [email or web address]. Please follow instruction sheet, as given.  This has been scheduled at 7:45am at our [facility] office, Friday July 27th.  Follow-Up:  We will call you with outcome of stress test - if normal will clear you for surgery and send this information to surgeon. Plan to follow up for routine office visit in 6 months, with [name].     If you need a refill on your cardiac medications before your next appointment, please call your pharmacy.

## 2017-07-07 ENCOUNTER — Telehealth (HOSPITAL_COMMUNITY): Payer: Self-pay

## 2017-07-07 ENCOUNTER — Telehealth: Payer: Self-pay | Admitting: Physician Assistant

## 2017-07-07 ENCOUNTER — Encounter: Payer: Self-pay | Admitting: Physician Assistant

## 2017-07-07 NOTE — Telephone Encounter (Signed)
Returned call to patient-patient is scheduled for lexiscan, wondering what medication he was instructed to hold.   Instructed that per protocol, does not need to hold medications, only caffeine in preparation for test.  Patient aware and verbalized understanding.

## 2017-07-07 NOTE — Pre-Procedure Instructions (Addendum)
Steven Mccormick  07/07/2017      [place], [place] [address] [place] [address] Phone: [phone] Fax: [phone]    Your procedure is scheduled on Thursday, July 14, 2017.  Report to [facility] at [email or web address].M.  Call this number if you have problems the morning of surgery:  [phone]   Remember:  Do not eat food or drink liquids after midnight.  Take these medicines the morning of surgery with A SIP OF WATER:  Amlodipine (Norvasc) Bisoprolol-Hydrochlorothiazide (Ziac) Ranitidine (Zantac) - if needed  7 days prior to surgery STOP taking any Aleve, Naproxen, Ibuprofen, Motrin, Advil, Goody's, BC's, all herbal medications, fish oil, and all vitamins - this includes your Vitamin B-12 and Vitamin E  Follow your doctors instructions regarding your Aspirin.  If no instructions were given by the doctor you will need to call the office to get instructions.  Your pre admission RN will also call for those instructions.  Continuing taking other medication as prescribed.    Do not wear jewelry.  Do not wear lotions, powders, or colognes, or deodorant.  Men may shave face and neck.  Do not bring valuables to the hospital.  [facility] is not responsible for any belongings or valuables.  Eyeglasses, contacts, dentures or bridgework may not be worn into surgery.  Leave your suitcase in the car.  After surgery it may be brought to your room.  For patients admitted to the hospital, discharge time will be determined by your treatment team.  Patients discharged the day of surgery will not be allowed to drive home.   Name and phone number of your driver:    Special instructions:   [place]- Preparing For Surgery  Before surgery, you can play an important role. Because skin is not sterile, your skin needs to be as free of germs as possible. You can reduce the number of germs on your skin by washing with CHG  (chlorahexidine gluconate) Soap before surgery.  CHG is an antiseptic cleaner which kills germs and bonds with the skin to continue killing germs even after washing.  Please do not use if you have an allergy to CHG or antibacterial soaps. If your skin becomes reddened/irritated stop using the CHG.  Do not shave (including legs and underarms) for at least 48 hours prior to first CHG shower. It is OK to shave your face.  Please follow these instructions carefully.   1. Shower the NIGHT BEFORE SURGERY and the MORNING OF SURGERY with CHG.   2. If you chose to wash your hair, wash your hair first as usual with your normal shampoo.  3. After you shampoo, rinse your hair and body thoroughly to remove the shampoo.  4. Use CHG as you would any other liquid soap. You can apply CHG directly to the skin and wash gently with a scrungie or a clean washcloth.   5. Apply the CHG Soap to your body ONLY FROM THE NECK DOWN.  Do not use on open wounds or open sores. Avoid contact with your eyes, ears, mouth and genitals (private parts). Wash genitals (private parts) with your normal soap.  6. Wash thoroughly, paying special attention to the area where your surgery will be performed.  7. Thoroughly rinse your body with warm water from the neck down.  8. DO NOT shower/wash with your normal soap after using and rinsing off the CHG Soap.  9.  Pat yourself dry with a CLEAN TOWEL.   10. Wear CLEAN PAJAMAS   11. Place CLEAN SHEETS on your bed the night of your first shower and DO NOT SLEEP WITH PETS.    Day of Surgery: Do not apply any deodorants/lotions. Please wear clean clothes to the hospital/surgery center.      Please read over the following fact sheets that you were given. Pain Booklet, Coughing and Deep Breathing, MRSA Information and Surgical Site Infection Prevention

## 2017-07-07 NOTE — Telephone Encounter (Signed)
New message    Pt is calling asking for a call back, he has a question about his stress test tomorrow.

## 2017-07-07 NOTE — Telephone Encounter (Signed)
Encounter complete. 

## 2017-07-08 ENCOUNTER — Inpatient Hospital Stay (HOSPITAL_COMMUNITY)
Admission: RE | Admit: 2017-07-08 | Discharge: 2017-07-08 | Disposition: A | Discharge status: Home / Self Care | Payer: Medicare Other | Source: Ambulatory Visit

## 2017-07-08 ENCOUNTER — Ambulatory Visit (HOSPITAL_COMMUNITY)
Admission: RE | Admit: 2017-07-08 | Discharge: 2017-07-08 | Disposition: A | Discharge status: Home / Self Care | Payer: Medicare Other | Source: Ambulatory Visit | Attending: Cardiovascular Disease | Admitting: Cardiovascular Disease

## 2017-07-08 DIAGNOSIS — E785 Hyperlipidemia, unspecified: Secondary | ICD-10-CM | POA: Diagnosis not present

## 2017-07-08 DIAGNOSIS — I714 Abdominal aortic aneurysm, without rupture, unspecified: Secondary | ICD-10-CM

## 2017-07-08 DIAGNOSIS — Z9189 Other specified personal risk factors, not elsewhere classified: Secondary | ICD-10-CM

## 2017-07-08 DIAGNOSIS — I708 Atherosclerosis of other arteries: Secondary | ICD-10-CM | POA: Insufficient documentation

## 2017-07-08 DIAGNOSIS — Z01810 Encounter for preprocedural cardiovascular examination: Secondary | ICD-10-CM | POA: Diagnosis not present

## 2017-07-08 DIAGNOSIS — Z8679 Personal history of other diseases of the circulatory system: Secondary | ICD-10-CM

## 2017-07-08 DIAGNOSIS — I2510 Atherosclerotic heart disease of native coronary artery without angina pectoris: Secondary | ICD-10-CM

## 2017-07-08 DIAGNOSIS — I2584 Coronary atherosclerosis due to calcified coronary lesion: Secondary | ICD-10-CM | POA: Insufficient documentation

## 2017-07-08 DIAGNOSIS — I10 Essential (primary) hypertension: Secondary | ICD-10-CM | POA: Diagnosis not present

## 2017-07-08 DIAGNOSIS — Z8249 Family history of ischemic heart disease and other diseases of the circulatory system: Secondary | ICD-10-CM | POA: Diagnosis not present

## 2017-07-08 DIAGNOSIS — I7090 Unspecified atherosclerosis: Secondary | ICD-10-CM

## 2017-07-08 DIAGNOSIS — I700 Atherosclerosis of aorta: Secondary | ICD-10-CM | POA: Diagnosis not present

## 2017-07-08 LAB — MYOCARDIAL PERFUSION IMAGING
CHL CUP NUCLEAR SRS: 0
CHL CUP NUCLEAR SSS: 3
LV sys vol: 44 mL
LVDIAVOL: 116 mL (ref 62–150)
Peak HR: 71 {beats}/min
Rest HR: 54 {beats}/min
SDS: 3
TID: 1.05

## 2017-07-08 MED ORDER — TECHNETIUM TC 99M TETROFOSMIN IV KIT
30.9000 | PACK | Freq: Once | INTRAVENOUS | Status: AC | PRN
  Administered 2017-07-08: 30.9 via INTRAVENOUS
  Filled 2017-07-08: qty 31

## 2017-07-08 MED ORDER — REGADENOSON 0.4 MG/5ML IV SOLN
0.4000 mg | Freq: Once | INTRAVENOUS | Status: AC
  Administered 2017-07-08: 0.4 mg via INTRAVENOUS

## 2017-07-08 MED ORDER — TECHNETIUM TC 99M TETROFOSMIN IV KIT
10.2000 | PACK | Freq: Once | INTRAVENOUS | Status: AC | PRN
  Administered 2017-07-08: 10.2 via INTRAVENOUS
  Filled 2017-07-08: qty 11

## 2017-07-08 NOTE — Progress Notes (Signed)
Late entry -   Received call back from [facility] at Dr. Lianne Moris office regarding patient's 81mg  ASA.  Patient is to NOT stop ASA and continue taking ASA.

## 2017-07-11 ENCOUNTER — Telehealth: Payer: Self-pay | Admitting: Physician Assistant

## 2017-07-11 NOTE — Telephone Encounter (Signed)
Patient returning call for results 

## 2017-07-11 NOTE — Telephone Encounter (Signed)
New message     Pt is calling to get test results, needs call asap he has to get to work.

## 2017-07-11 NOTE — Telephone Encounter (Signed)
Left message that test was okay for surgery and  Information sent to Dr Bridgett Larsson  ANY FURTHER QUESTION MAY cal back.

## 2017-07-11 NOTE — Telephone Encounter (Signed)
LMTCB  Notes recorded by Almyra Deforest, PA on 07/08/2017 at 4:58 PM EDT This is a low risk stress test finding, would not recommend any additional workup, he is a low risk patient for EVAR surgery planned by Dr. Bridgett Larsson. Will forward this report to Dr. Bridgett Larsson as well.

## 2017-07-12 ENCOUNTER — Encounter (HOSPITAL_COMMUNITY): Payer: Self-pay

## 2017-07-12 ENCOUNTER — Encounter (HOSPITAL_COMMUNITY)
Admission: RE | Admit: 2017-07-12 | Discharge: 2017-07-12 | Disposition: A | Discharge status: Home / Self Care | Payer: Medicare Other | Source: Ambulatory Visit | Attending: Vascular Surgery | Admitting: Vascular Surgery

## 2017-07-12 DIAGNOSIS — Z79899 Other long term (current) drug therapy: Secondary | ICD-10-CM | POA: Insufficient documentation

## 2017-07-12 DIAGNOSIS — I714 Abdominal aortic aneurysm, without rupture: Secondary | ICD-10-CM

## 2017-07-12 DIAGNOSIS — Z01812 Encounter for preprocedural laboratory examination: Secondary | ICD-10-CM | POA: Insufficient documentation

## 2017-07-12 DIAGNOSIS — Z0183 Encounter for blood typing: Secondary | ICD-10-CM | POA: Insufficient documentation

## 2017-07-12 HISTORY — DX: Abdominal aortic aneurysm, without rupture, unspecified: I71.40

## 2017-07-12 HISTORY — DX: Abdominal aortic aneurysm, without rupture: I71.4

## 2017-07-12 LAB — BLOOD GAS, ARTERIAL
Acid-Base Excess: 1.7 mmol/L (ref 0.0–2.0)
Bicarbonate: 25.9 mmol/L (ref 20.0–28.0)
DRAWN BY: 421801
FIO2: 21
O2 Saturation: 97.1 %
PATIENT TEMPERATURE: 98.6
pCO2 arterial: 42 mmHg (ref 32.0–48.0)
pH, Arterial: 7.407 (ref 7.350–7.450)
pO2, Arterial: 91.6 mmHg (ref 83.0–108.0)

## 2017-07-12 LAB — SURGICAL PCR SCREEN
MRSA, PCR: NEGATIVE
Staphylococcus aureus: NEGATIVE

## 2017-07-12 LAB — COMPREHENSIVE METABOLIC PANEL
ALBUMIN: 4 g/dL (ref 3.5–5.0)
ALK PHOS: 65 U/L (ref 38–126)
ALT: 20 U/L (ref 17–63)
ANION GAP: 9 (ref 5–15)
AST: 24 U/L (ref 15–41)
BUN: 22 mg/dL — ABNORMAL HIGH (ref 6–20)
CALCIUM: 9.2 mg/dL (ref 8.9–10.3)
CO2: 24 mmol/L (ref 22–32)
Chloride: 104 mmol/L (ref 101–111)
Creatinine, Ser: 0.99 mg/dL (ref 0.61–1.24)
GFR calc Af Amer: >60 mL/min (ref >60)
GFR calc non Af Amer: >60 mL/min (ref >60)
GLUCOSE: 118 mg/dL — AB (ref 65–99)
Potassium: 4.2 mmol/L (ref 3.5–5.1)
SODIUM: 137 mmol/L (ref 135–145)
Total Bilirubin: 0.6 mg/dL (ref 0.3–1.2)
Total Protein: 6.9 g/dL (ref 6.5–8.1)

## 2017-07-12 LAB — URINALYSIS, ROUTINE W REFLEX MICROSCOPIC
BILIRUBIN URINE: NEGATIVE
Glucose, UA: NEGATIVE mg/dL
HGB URINE DIPSTICK: NEGATIVE
KETONES UR: NEGATIVE mg/dL
Leukocytes, UA: NEGATIVE
Nitrite: NEGATIVE
PROTEIN: NEGATIVE mg/dL
Specific Gravity, Urine: 1.016 (ref 1.005–1.030)
pH: 6 (ref 5.0–8.0)

## 2017-07-12 LAB — CBC
HCT: 37.8 % — ABNORMAL LOW (ref 39.0–52.0)
HEMOGLOBIN: 12.8 g/dL — AB (ref 13.0–17.0)
MCH: 32.2 pg (ref 26.0–34.0)
MCHC: 33.9 g/dL (ref 30.0–36.0)
MCV: 95 fL (ref 78.0–100.0)
Platelets: 226 10*3/uL (ref 150–400)
RBC: 3.98 MIL/uL — AB (ref 4.22–5.81)
RDW: 12.5 % (ref 11.5–15.5)
WBC: 7.5 10*3/uL (ref 4.0–10.5)

## 2017-07-12 LAB — PROTIME-INR
INR: 1.11
Prothrombin Time: 14.3 seconds (ref 11.4–15.2)

## 2017-07-12 LAB — ABO/RH: ABO/RH(D): O POS

## 2017-07-12 LAB — APTT: APTT: 30 s (ref 24–36)

## 2017-07-12 LAB — PREPARE RBC (CROSSMATCH)

## 2017-07-12 MED ORDER — CHLORHEXIDINE GLUCONATE 4 % EX LIQD: 60.0000 mL | Freq: Once | CUTANEOUS | Status: DC

## 2017-07-14 ENCOUNTER — Encounter (HOSPITAL_COMMUNITY): Payer: Self-pay | Admitting: *Deleted

## 2017-07-14 ENCOUNTER — Inpatient Hospital Stay (HOSPITAL_COMMUNITY)
Admission: RE | Admit: 2017-07-14 | Discharge: 2017-07-15 | DRG: 269 | Disposition: A | Discharge status: Home / Self Care | Payer: Medicare Other | Attending: Vascular Surgery | Admitting: Vascular Surgery

## 2017-07-14 ENCOUNTER — Inpatient Hospital Stay (HOSPITAL_COMMUNITY): Payer: Medicare Other | Admitting: Anesthesiology

## 2017-07-14 ENCOUNTER — Encounter (HOSPITAL_COMMUNITY)
Admission: RE | Disposition: A | Discharge status: Home / Self Care | Payer: Self-pay | Source: Home / Self Care | Attending: Vascular Surgery

## 2017-07-14 DIAGNOSIS — R0989 Other specified symptoms and signs involving the circulatory and respiratory systems: Secondary | ICD-10-CM | POA: Diagnosis not present

## 2017-07-14 DIAGNOSIS — I714 Abdominal aortic aneurysm, without rupture, unspecified: Secondary | ICD-10-CM | POA: Diagnosis present

## 2017-07-14 DIAGNOSIS — N400 Enlarged prostate without lower urinary tract symptoms: Secondary | ICD-10-CM | POA: Diagnosis present

## 2017-07-14 DIAGNOSIS — K551 Chronic vascular disorders of intestine: Secondary | ICD-10-CM | POA: Diagnosis present

## 2017-07-14 DIAGNOSIS — Z79899 Other long term (current) drug therapy: Secondary | ICD-10-CM | POA: Diagnosis not present

## 2017-07-14 DIAGNOSIS — I10 Essential (primary) hypertension: Secondary | ICD-10-CM | POA: Diagnosis present

## 2017-07-14 DIAGNOSIS — I700 Atherosclerosis of aorta: Secondary | ICD-10-CM | POA: Diagnosis not present

## 2017-07-14 DIAGNOSIS — Z905 Acquired absence of kidney: Secondary | ICD-10-CM | POA: Diagnosis not present

## 2017-07-14 DIAGNOSIS — Z87891 Personal history of nicotine dependence: Secondary | ICD-10-CM

## 2017-07-14 DIAGNOSIS — I708 Atherosclerosis of other arteries: Secondary | ICD-10-CM | POA: Diagnosis not present

## 2017-07-14 DIAGNOSIS — Z7982 Long term (current) use of aspirin: Secondary | ICD-10-CM

## 2017-07-14 HISTORY — PX: ABDOMINAL AORTIC ENDOVASCULAR STENT GRAFT: SHX5707

## 2017-07-14 LAB — CREATININE, SERUM
CREATININE: 1.26 mg/dL — AB (ref 0.61–1.24)
GFR calc Af Amer: >60 mL/min (ref >60)
GFR calc non Af Amer: 52 mL/min — ABNORMAL LOW (ref >60)

## 2017-07-14 LAB — CBC
HCT: 33.9 % — ABNORMAL LOW (ref 39.0–52.0)
HEMOGLOBIN: 11.2 g/dL — AB (ref 13.0–17.0)
MCH: 31.2 pg (ref 26.0–34.0)
MCHC: 33 g/dL (ref 30.0–36.0)
MCV: 94.4 fL (ref 78.0–100.0)
Platelets: 188 10*3/uL (ref 150–400)
RBC: 3.59 MIL/uL — ABNORMAL LOW (ref 4.22–5.81)
RDW: 12.3 % (ref 11.5–15.5)
WBC: 8.9 10*3/uL (ref 4.0–10.5)

## 2017-07-14 SURGERY — INSERTION, ENDOVASCULAR STENT GRAFT, AORTA, ABDOMINAL
Anesthesia: General | Site: Groin

## 2017-07-14 MED ORDER — HEPARIN SODIUM (PORCINE) 1000 UNIT/ML IJ SOLN
INTRAMUSCULAR | Status: DC | PRN
  Administered 2017-07-14: 6500 [IU] via INTRAVENOUS

## 2017-07-14 MED ORDER — FENTANYL CITRATE (PF) 100 MCG/2ML IJ SOLN
INTRAMUSCULAR | Status: DC | PRN
  Administered 2017-07-14 (×2): 50 ug via INTRAVENOUS
  Administered 2017-07-14: 100 ug via INTRAVENOUS

## 2017-07-14 MED ORDER — ACETAMINOPHEN 325 MG PO TABS: 325.0000 mg | ORAL_TABLET | ORAL | Status: DC | PRN

## 2017-07-14 MED ORDER — 0.9 % SODIUM CHLORIDE (POUR BTL) OPTIME
TOPICAL | Status: DC | PRN
  Administered 2017-07-14: 1000 mL

## 2017-07-14 MED ORDER — PHENOL 1.4 % MT LIQD: 1.0000 | OROMUCOSAL | Status: DC | PRN

## 2017-07-14 MED ORDER — METOPROLOL TARTRATE 5 MG/5ML IV SOLN: 2.0000 mg | INTRAVENOUS | Status: DC | PRN

## 2017-07-14 MED ORDER — OXYCODONE HCL 5 MG PO TABS: 5.0000 mg | ORAL_TABLET | Freq: Once | ORAL | Status: DC | PRN

## 2017-07-14 MED ORDER — SENNOSIDES-DOCUSATE SODIUM 8.6-50 MG PO TABS: 1.0000 | ORAL_TABLET | Freq: Every evening | ORAL | Status: DC | PRN

## 2017-07-14 MED ORDER — DEXTROSE 5 % IV SOLN
1.5000 g | INTRAVENOUS | Status: AC
  Administered 2017-07-14: 1.5 g via INTRAVENOUS
  Filled 2017-07-14: qty 1.5

## 2017-07-14 MED ORDER — PROPOFOL 10 MG/ML IV BOLUS
INTRAVENOUS | Status: AC
  Filled 2017-07-14: qty 40

## 2017-07-14 MED ORDER — TAMSULOSIN HCL 0.4 MG PO CAPS
0.4000 mg | ORAL_CAPSULE | Freq: Every day | ORAL | Status: DC
  Administered 2017-07-14: 0.4 mg via ORAL
  Filled 2017-07-14: qty 1

## 2017-07-14 MED ORDER — ROCURONIUM BROMIDE 100 MG/10ML IV SOLN
INTRAVENOUS | Status: DC | PRN
  Administered 2017-07-14: 50 mg via INTRAVENOUS
  Administered 2017-07-14: 10 mg via INTRAVENOUS

## 2017-07-14 MED ORDER — VITAMIN E 180 MG (400 UNIT) PO CAPS
400.0000 [IU] | ORAL_CAPSULE | Freq: Every day | ORAL | Status: DC
  Filled 2017-07-14 (×2): qty 1

## 2017-07-14 MED ORDER — IODIXANOL 320 MG/ML IV SOLN
INTRAVENOUS | Status: DC | PRN
  Administered 2017-07-14: 60.7 mL via INTRAVENOUS

## 2017-07-14 MED ORDER — PHENYLEPHRINE 40 MCG/ML (10ML) SYRINGE FOR IV PUSH (FOR BLOOD PRESSURE SUPPORT)
PREFILLED_SYRINGE | INTRAVENOUS | Status: AC
  Filled 2017-07-14: qty 10

## 2017-07-14 MED ORDER — MIDAZOLAM HCL 2 MG/2ML IJ SOLN
INTRAMUSCULAR | Status: AC
  Filled 2017-07-14: qty 2

## 2017-07-14 MED ORDER — PROPOFOL 10 MG/ML IV BOLUS
INTRAVENOUS | Status: DC | PRN
  Administered 2017-07-14: 100 mg via INTRAVENOUS
  Administered 2017-07-14: 20 mg via INTRAVENOUS

## 2017-07-14 MED ORDER — SUGAMMADEX SODIUM 200 MG/2ML IV SOLN
INTRAVENOUS | Status: AC
  Filled 2017-07-14: qty 2

## 2017-07-14 MED ORDER — GUAIFENESIN-DM 100-10 MG/5ML PO SYRP: 15.0000 mL | ORAL_SOLUTION | ORAL | Status: DC | PRN

## 2017-07-14 MED ORDER — ONDANSETRON HCL 4 MG/2ML IJ SOLN: 4.0000 mg | Freq: Once | INTRAMUSCULAR | Status: DC | PRN

## 2017-07-14 MED ORDER — BISOPROLOL-HYDROCHLOROTHIAZIDE 5-6.25 MG PO TABS
1.0000 | ORAL_TABLET | Freq: Every day | ORAL | Status: DC
  Administered 2017-07-15: 1 via ORAL
  Filled 2017-07-14 (×2): qty 1

## 2017-07-14 MED ORDER — DOCUSATE SODIUM 100 MG PO CAPS
100.0000 mg | ORAL_CAPSULE | Freq: Every day | ORAL | Status: DC
  Administered 2017-07-15: 100 mg via ORAL
  Filled 2017-07-14: qty 1

## 2017-07-14 MED ORDER — MAGNESIUM SULFATE 2 GM/50ML IV SOLN
2.0000 g | Freq: Every day | INTRAVENOUS | Status: DC | PRN
  Filled 2017-07-14: qty 50

## 2017-07-14 MED ORDER — OXYCODONE HCL 5 MG/5ML PO SOLN: 5.0000 mg | Freq: Once | ORAL | Status: DC | PRN

## 2017-07-14 MED ORDER — LISINOPRIL 10 MG PO TABS
10.0000 mg | ORAL_TABLET | Freq: Every day | ORAL | Status: DC
Start: 2017-07-14 — End: 2017-07-15
  Administered 2017-07-15: 10 mg via ORAL
  Filled 2017-07-14: qty 1

## 2017-07-14 MED ORDER — AMLODIPINE BESYLATE 10 MG PO TABS
10.0000 mg | ORAL_TABLET | Freq: Every day | ORAL | Status: DC
  Administered 2017-07-15: 10 mg via ORAL
  Filled 2017-07-14: qty 1

## 2017-07-14 MED ORDER — B-12 5000 MCG PO CAPS: 5000.0000 ug | ORAL_CAPSULE | Freq: Every day | ORAL | Status: DC

## 2017-07-14 MED ORDER — PHENYLEPHRINE HCL 10 MG/ML IJ SOLN
INTRAVENOUS | Status: DC | PRN
  Administered 2017-07-14: 25 ug/min via INTRAVENOUS

## 2017-07-14 MED ORDER — OXYCODONE HCL 5 MG PO TABS: 5.0000 mg | ORAL_TABLET | ORAL | Status: DC | PRN

## 2017-07-14 MED ORDER — BISACODYL 10 MG RE SUPP: 10.0000 mg | Freq: Every day | RECTAL | Status: DC | PRN

## 2017-07-14 MED ORDER — MORPHINE SULFATE (PF) 2 MG/ML IV SOLN: 2.0000 mg | INTRAVENOUS | Status: DC | PRN

## 2017-07-14 MED ORDER — EPHEDRINE SULFATE 50 MG/ML IJ SOLN
INTRAMUSCULAR | Status: DC | PRN
  Administered 2017-07-14 (×3): 10 mg via INTRAVENOUS

## 2017-07-14 MED ORDER — SUGAMMADEX SODIUM 200 MG/2ML IV SOLN
INTRAVENOUS | Status: AC
Start: 2017-07-14 — End: 2017-07-14
  Filled 2017-07-14: qty 2

## 2017-07-14 MED ORDER — ACETAMINOPHEN 650 MG RE SUPP: 325.0000 mg | RECTAL | Status: DC | PRN

## 2017-07-14 MED ORDER — ROCURONIUM BROMIDE 10 MG/ML (PF) SYRINGE
PREFILLED_SYRINGE | INTRAVENOUS | Status: AC
  Filled 2017-07-14: qty 5

## 2017-07-14 MED ORDER — HYDRALAZINE HCL 20 MG/ML IJ SOLN: 5.0000 mg | INTRAMUSCULAR | Status: DC | PRN

## 2017-07-14 MED ORDER — SIMVASTATIN 20 MG PO TABS
20.0000 mg | ORAL_TABLET | Freq: Every day | ORAL | Status: DC
Start: 2017-07-14 — End: 2017-07-15
  Administered 2017-07-15: 20 mg via ORAL
  Filled 2017-07-14: qty 1

## 2017-07-14 MED ORDER — VITAMIN B-12 1000 MCG PO TABS
1000.0000 ug | ORAL_TABLET | Freq: Every day | ORAL | Status: DC
  Administered 2017-07-15: 1000 ug via ORAL
  Filled 2017-07-14: qty 1

## 2017-07-14 MED ORDER — DEXTROSE 5 % IV SOLN
1.5000 g | Freq: Two times a day (BID) | INTRAVENOUS | Status: AC
  Administered 2017-07-14 – 2017-07-15 (×2): 1.5 g via INTRAVENOUS
  Filled 2017-07-14 (×2): qty 1.5

## 2017-07-14 MED ORDER — FAMOTIDINE 20 MG PO TABS
20.0000 mg | ORAL_TABLET | Freq: Two times a day (BID) | ORAL | Status: DC
  Administered 2017-07-14: 20 mg via ORAL
  Filled 2017-07-14 (×2): qty 1

## 2017-07-14 MED ORDER — PROTAMINE SULFATE 10 MG/ML IV SOLN
INTRAVENOUS | Status: DC | PRN
Start: 2017-07-14 — End: 2017-07-14
  Administered 2017-07-14: 40 mg via INTRAVENOUS

## 2017-07-14 MED ORDER — SODIUM CHLORIDE 0.9 % IV SOLN
INTRAVENOUS | Status: DC | PRN
  Administered 2017-07-14: 09:00:00 500 mL

## 2017-07-14 MED ORDER — SODIUM CHLORIDE 0.9 % IV SOLN: 500.0000 mL | Freq: Once | INTRAVENOUS | Status: DC | PRN

## 2017-07-14 MED ORDER — SUGAMMADEX SODIUM 200 MG/2ML IV SOLN
INTRAVENOUS | Status: DC | PRN
  Administered 2017-07-14: 125 mg via INTRAVENOUS

## 2017-07-14 MED ORDER — FLEET ENEMA 7-19 GM/118ML RE ENEM: 1.0000 | ENEMA | Freq: Once | RECTAL | Status: DC | PRN

## 2017-07-14 MED ORDER — ASPIRIN 81 MG PO CHEW
81.0000 mg | CHEWABLE_TABLET | Freq: Every day | ORAL | Status: DC
  Administered 2017-07-15: 81 mg via ORAL
  Filled 2017-07-14 (×2): qty 1

## 2017-07-14 MED ORDER — ONDANSETRON HCL 4 MG/2ML IJ SOLN: 4.0000 mg | Freq: Four times a day (QID) | INTRAMUSCULAR | Status: DC | PRN

## 2017-07-14 MED ORDER — SODIUM CHLORIDE 0.9 % IV SOLN
INTRAVENOUS | Status: DC
  Administered 2017-07-14: 16:00:00 via INTRAVENOUS

## 2017-07-14 MED ORDER — POTASSIUM CHLORIDE CRYS ER 20 MEQ PO TBCR: 20.0000 meq | EXTENDED_RELEASE_TABLET | Freq: Every day | ORAL | Status: DC | PRN

## 2017-07-14 MED ORDER — ALUM & MAG HYDROXIDE-SIMETH 200-200-20 MG/5ML PO SUSP: 15.0000 mL | ORAL | Status: DC | PRN

## 2017-07-14 MED ORDER — FENTANYL CITRATE (PF) 250 MCG/5ML IJ SOLN
INTRAMUSCULAR | Status: AC
  Filled 2017-07-14: qty 5

## 2017-07-14 MED ORDER — GLYCOPYRROLATE 0.2 MG/ML IJ SOLN
INTRAMUSCULAR | Status: DC | PRN
  Administered 2017-07-14 (×2): 0.2 mg via INTRAVENOUS

## 2017-07-14 MED ORDER — LABETALOL HCL 5 MG/ML IV SOLN: 10.0000 mg | INTRAVENOUS | Status: DC | PRN

## 2017-07-14 MED ORDER — LATANOPROST 0.005 % OP SOLN
1.0000 [drp] | Freq: Every day | OPHTHALMIC | Status: DC
  Administered 2017-07-14: 1 [drp] via OPHTHALMIC
  Filled 2017-07-14: qty 2.5

## 2017-07-14 MED ORDER — SODIUM CHLORIDE 0.9 % IV SOLN: INTRAVENOUS | Status: DC

## 2017-07-14 MED ORDER — ENOXAPARIN SODIUM 40 MG/0.4ML ~~LOC~~ SOLN
40.0000 mg | SUBCUTANEOUS | Status: DC
  Filled 2017-07-14: qty 0.4

## 2017-07-14 MED ORDER — LACTATED RINGERS IV SOLN
INTRAVENOUS | Status: DC | PRN
  Administered 2017-07-14 (×2): via INTRAVENOUS

## 2017-07-14 MED ORDER — ONDANSETRON HCL 4 MG/2ML IJ SOLN
INTRAMUSCULAR | Status: AC
  Filled 2017-07-14: qty 2

## 2017-07-14 MED ORDER — LACTATED RINGERS IV SOLN
INTRAVENOUS | Status: DC | PRN
  Administered 2017-07-14: 07:00:00 via INTRAVENOUS

## 2017-07-14 MED ORDER — PHENYLEPHRINE HCL 10 MG/ML IJ SOLN
INTRAMUSCULAR | Status: DC | PRN
  Administered 2017-07-14: 40 ug via INTRAVENOUS

## 2017-07-14 MED ORDER — LIDOCAINE 2% (20 MG/ML) 5 ML SYRINGE
INTRAMUSCULAR | Status: AC
  Filled 2017-07-14: qty 5

## 2017-07-14 MED ORDER — ONDANSETRON HCL 4 MG/2ML IJ SOLN
INTRAMUSCULAR | Status: DC | PRN
  Administered 2017-07-14: 4 mg via INTRAVENOUS

## 2017-07-14 MED ORDER — EPHEDRINE 5 MG/ML INJ
INTRAVENOUS | Status: AC
Start: 2017-07-14 — End: 2017-07-14
  Filled 2017-07-14: qty 10

## 2017-07-14 MED ORDER — FENTANYL CITRATE (PF) 100 MCG/2ML IJ SOLN: 25.0000 ug | INTRAMUSCULAR | Status: DC | PRN

## 2017-07-14 MED ORDER — LIDOCAINE HCL (CARDIAC) 20 MG/ML IV SOLN
INTRAVENOUS | Status: DC | PRN
  Administered 2017-07-14: 40 mg via INTRAVENOUS

## 2017-07-14 SURGICAL SUPPLY — 52 items
CANISTER SUCT 3000ML PPV (MISCELLANEOUS) ×3 IMPLANT
CATH ANGIO 5F BER2 65CM (CATHETERS) ×3 IMPLANT
CATH BEACON 5.038 65CM KMP-01 (CATHETERS) IMPLANT
CATH OMNI FLUSH .035X70CM (CATHETERS) ×3 IMPLANT
COVER PROBE W GEL 5X96 (DRAPES) ×3 IMPLANT
DERMABOND ADVANCED (GAUZE/BANDAGES/DRESSINGS) ×4
DERMABOND ADVANCED .7 DNX12 (GAUZE/BANDAGES/DRESSINGS) ×2 IMPLANT
DEVICE CLOSURE PERCLS PRGLD 6F (VASCULAR PRODUCTS) ×7 IMPLANT
DRSG TEGADERM 2-3/8X2-3/4 SM (GAUZE/BANDAGES/DRESSINGS) ×6 IMPLANT
DRYSEAL FLEXSHEATH 12FR 33CM (SHEATH) ×2
DRYSEAL FLEXSHEATH 18FR 33CM (SHEATH) ×2
ELECT REM PT RETURN 9FT ADLT (ELECTROSURGICAL) ×6
ELECTRODE REM PT RTRN 9FT ADLT (ELECTROSURGICAL) ×2 IMPLANT
EXCLUDER TNK 26X14.5MMX12CM (Endovascular Graft) ×1 IMPLANT
EXCLUDER TRUNK 26X14.5MMX12CM (Endovascular Graft) ×3 IMPLANT
GAUZE SPONGE 2X2 8PLY STRL LF (GAUZE/BANDAGES/DRESSINGS) ×2 IMPLANT
GLOVE BIO SURGEON STRL SZ7 (GLOVE) ×3 IMPLANT
GLOVE BIOGEL PI IND STRL 7.5 (GLOVE) ×1 IMPLANT
GLOVE BIOGEL PI INDICATOR 7.5 (GLOVE) ×2
GOWN STRL REUS W/ TWL LRG LVL3 (GOWN DISPOSABLE) ×4 IMPLANT
GOWN STRL REUS W/TWL LRG LVL3 (GOWN DISPOSABLE) ×8
GRAFT BALLN CATH 65CM (STENTS) ×1 IMPLANT
KIT BASIN OR (CUSTOM PROCEDURE TRAY) ×3 IMPLANT
KIT ROOM TURNOVER OR (KITS) ×3 IMPLANT
LEG CONTRALATERAL 16X16X13.5 (Endovascular Graft) ×2 IMPLANT
LEG CONTRALATERAL 16X16X9.5 (Endovascular Graft) ×3 IMPLANT
NEEDLE PERC 18GX7CM (NEEDLE) ×3 IMPLANT
NS IRRIG 1000ML POUR BTL (IV SOLUTION) ×3 IMPLANT
PACK ENDOVASCULAR (PACKS) ×3 IMPLANT
PAD ARMBOARD 7.5X6 YLW CONV (MISCELLANEOUS) ×6 IMPLANT
PERCLOSE PROGLIDE 6F (VASCULAR PRODUCTS) ×21
SHEATH AVANTI 11CM 8FR (MISCELLANEOUS) IMPLANT
SHEATH BRITE TIP 8FR 23CM (MISCELLANEOUS) ×3 IMPLANT
SHEATH DRYSEAL FLEX 12FR 33CM (SHEATH) ×1 IMPLANT
SHEATH DRYSEAL FLEX 18FR 33CM (SHEATH) ×1 IMPLANT
SHEATH PINNACLE 8F 10CM (SHEATH) ×3 IMPLANT
SPONGE GAUZE 2X2 STER 10/PKG (GAUZE/BANDAGES/DRESSINGS) ×4
STAPLER VISISTAT 35W (STAPLE) IMPLANT
STENT GRAFT BALLN CATH 65CM (STENTS) ×2
STENT GRAFT CONTRALAT 16X13.5 (Endovascular Graft) ×1 IMPLANT
STOPCOCK MORSE 400PSI 3WAY (MISCELLANEOUS) ×3 IMPLANT
SUT ETHILON 3 0 PS 1 (SUTURE) IMPLANT
SUT MNCRL AB 4-0 PS2 18 (SUTURE) ×6 IMPLANT
SUT PROLENE 5 0 C 1 24 (SUTURE) IMPLANT
SUT VIC AB 2-0 CTX 36 (SUTURE) IMPLANT
SUT VIC AB 3-0 SH 27 (SUTURE)
SUT VIC AB 3-0 SH 27X BRD (SUTURE) IMPLANT
SYR 30ML LL (SYRINGE) ×3 IMPLANT
TRAY FOLEY W/METER SILVER 16FR (SET/KITS/TRAYS/PACK) ×3 IMPLANT
TUBING HIGH PRESSURE 120CM (CONNECTOR) ×3 IMPLANT
WIRE AMPLATZ SS-J .035X180CM (WIRE) ×6 IMPLANT
WIRE BENTSON .035X145CM (WIRE) ×6 IMPLANT

## 2017-07-14 NOTE — Anesthesia Procedure Notes (Signed)
Procedure Name: Intubation Date/Time: 07/14/2017 7:47 AM Performed by: Scheryl Darter Pre-anesthesia Checklist: Patient identified, Emergency Drugs available, Suction available and Patient being monitored Patient Re-evaluated:Patient Re-evaluated prior to induction Oxygen Delivery Method: Circle System Utilized Preoxygenation: Pre-oxygenation with 100% oxygen Induction Type: IV induction Ventilation: Mask ventilation without difficulty Laryngoscope Size: Miller and 3 Grade View: Grade I Tube type: Oral Tube size: 7.5 mm Number of attempts: 1 Airway Equipment and Method: Stylet and Oral airway Placement Confirmation: ETT inserted through vocal cords under direct vision,  positive ETCO2 and breath sounds checked- equal and bilateral Secured at: 22 cm Tube secured with: Tape Dental Injury: Teeth and Oropharynx as per pre-operative assessment

## 2017-07-14 NOTE — Op Note (Addendum)
OPERATIVE NOTE   PROCEDURE: 1. Left common femoral artery cannulation under ultrasound guidance 2. "Preclose" repair of left common femoral artery, i.e. proglide placement x 4 3. Endovascular aortic repair with main body with attached limb (Gore C3 26 mm x 14 mm x 12 cm) 4. Placement of ipsilateral iliac limb extension (Gore 16 mm x 9.5 cm)  (Completed by Dr. Scot Dock) 5. Right common femoral artery cannulation under ultrasound guidance 6. "Preclose" repair of right common femoral artery, i.e. proglide placement x 3 7. Placement of contralateral iliac limb (Gore 16 x 13.5 cm)  PRE-OPERATIVE DIAGNOSIS: large abdominal aortic aneurysm   POST-OPERATIVE DIAGNOSIS: same as above   CO-SURGEONS:  Adele Barthel, MD, Gae Gallop, MD  ANESTHESIA: general  ESTIMATED BLOOD LOSS: 50 cc  FINDING(S): 1.  Successful exclusion of aneurysm at end of case  2.  Endoleaks: none 3.  Patent right renal artery (s/p left kidney nephrectomy) 4.  Patent bilateral patent internal iliac arteries 4.  Distal pedal signals: all dopplerable  SPECIMEN(S):  none  INDICATIONS:   Steven Mccormick is a 82 y.o. (March 28, 1935) male who presents with large abdominal aortic aneurysm.  I recommended endovascular aortic repair.  The patient is aware the risks of endovascular aortic surgery include but are not limited to: bleeding, need for transfusion, infection, death, stroke, paralysis, wound complications, spinal, pelvic and bowel ischemia, extended ventilation, anaphylactic reaction to contrast, contrast induced nephropathy, embolism, and need for additional procedure to address endoleaks.  Overall, I cited a mortality rate of 1-2% and morbidity rate of 15%.  The patient is aware of the risks and agree to proceed.   DESCRIPTION: After obtaining full informed written consent, the patient was brought back to the operating room and placed supine upon the operating table.  The patient received IV antibiotics  prior to induction.  A procedure time out was completed and the correct surgical site was verified.  After obtaining adequate anesthesia, the patient was prepped and draped in the standard fashion for: open or endovascular aortic procedure.  A two surgeon technique was utilized to maintain constant control of the main body of the endograft and to expedite completion of this case.  Ipsilateral Access  I turned my attention to the left groin  Under ultrasound guidance,  the common femoral artery was then cannulated with a 18 gauge needle.  The Bentson wire was passed up into the aorta.  The needle was exchanged for a 8-Fr dilator, which was used to dilate the subcutaneous tract and arterial puncture.  The dilator was exchanged for a Proglide device.  This was deployed at 30 degrees medial rotation.  The sutures broke so I had to exchange this Proglide device for a new one that was deployed at 30 degree lateral rotation.  The Proglide sutures were tagged and then the Bentson wire was reloaded in the used Proglide device.  The used device was exchanged for a new Proglide device.  The new device was deployed at 30 degrees medial rotation.  These sutures also failed and so the Proglide device was exchange for another.  This device was deployed at >30 degree medial rotation.  The new Proglide sutures were tagged and then the Bentson wire was reloaded in the Proglide device.  The device was exchanged for a short 8-Fr sheath.  In this fashion, the "Preclose" technique repair of the common femoral artery was completed.    Contralateral Access See Dr. Nicole Cella dictation for details on the right common femoral  artery cannulation.  The same Preclose technique was utilized on this side.  Again, issues with arterial calcification were encountered.  At this point, Dr. Scot Dock steered a Bentson wire into the suprarenal aorta with the help of a BER-2 catheter.  The wire was exchanged for an Amplatz wire.  The catheter was  removed and then the sheath exchanged for a 12-Fr sheath.  Heparinization The patient was given 6500 units of Heparin intravenously, which was a therapeutic bolus.   Placement of Main Body of Endograft and Initial Aortogram I placed a BER-2 catheter over the ipsilateral wire and then steered into the suprarenal segment of the aorta.  I exchanged the wire in the suprarenal segment of the aorta for an Amplatz wire which was advanced into the descending thoracic aorta.  The position of the wire was marked to avoid advancing the wire into the aortic arch.  The catheter was removed and then the sheath was exchanged for a 18-Fr Dryseal sheath.  The dilator was removed and the sheath loaded with heparinized saline.    The marker pigtail catheter was loaded over the wire and placed at L2.  The wire was removed and the pigtail catheter was connected to the power injector circuit.  The main body of the endograft (C3 26 mm x 14 mm x 12 cm) was loaded over the ipsilateral wire into the presumed infrarenal position.  A power injector aortogram was completed at appropriate angulation to evaluate the takeoff of the renal arteries.    I deployed the main body at the level of the right renal artery which the lowest.  A completion aortogram was completed which demonstrated: 1-2 mm drop off right renal orifice and obstruction of the gate.  Based on the images, I reconstrained the C3 main body and repositioned the graft while rotating the gate.  This allowed the gate to fully open.  I released the main body at the level of the right renal artery.  A repeat aortogram was completed which demonstrated: 1-2 mm off the right renal artery orifice.  With a 30 mm neck, I felt this was adequate.  Buddy Wire Placement and Contralateral Gate Cannnulation At this point, Dr. Scot Dock exchanged the pigtail catheter for an Amplatz wire.  He than used the buddy wire technique to place a BER-2 catheter and glidewire adjacent to the  Amplatz wire.  Using this combination, he placed the wire adjacent to the contralateral gate.  Eventually using a BER-2 and a Bentson wire, he was able to cannulate the contralateral gate.  He advanced the wire and catheter into the main body without difficulty.  The wire was exchanged for the Amplatz wire.  He exchanged the catheter for a pigtail catheter.  He pulled the Amplatz wire back and allowed the crook of the catheter to reform.  He spun the formed catheter at the top of the main body, demonstrating intragraft location.  He replaced the Amplatz wire in the descending thoracic aorta and then pulled the marker pigtail catheter down to the flow divider.    Contralateral Limb Placement He then did a left anterior oblique pelvic injection to image the takeoff of the contralateral internal iliac artery.  Based on the measurements, I selected a 16 mm x 13.5 cm iliac limb.  This was deployed with adequate overlap.  The stent delivery device was removed.  Ipsilateral Limb Extension At this point, I turned my attention to the ipsilateral groin.  I did a right anterior oblique pelvic injection  to image the takeoff of the left ipsilateral internal iliac artery.  Based on the images, the ipsilateral limb would not adequate overlap in the common iliac artery, so a bell bottom iliac limb was needed.  I fully deployed the ipsilateral limb and removed the main body stent delivery device.  I selected a 16 mm x 9.5 cm iliac limb extension and deployed it just proximal to the internal iliac artery.  The stent delivery device was removed.  Aortic and Limb Molding The Q50 molding balloon was loaded on the contralateral wire and advanced into the main body.  I molded the proximal graft, overlapping segments just distal to the flow divider and the distal extent of the graft in the contralateral iliac artery.  The balloon was deflated and placed on the ipsilateral wire.  I inflated the balloon at the distal extent of  the ipsilateral iliac artery.  The balloon was deflated and removed.  Completion Aortogram The pigtail catheter was replaced on the contralateral wire and advanced into the suprarenal aorta.  The wire was removed and the catheter was connected to the power injector circuit.  A power injector aortogram was completed: no obvious endoleak was evident with intact right renal artery flow and bilateral internal iliac artery flow.  I did see some inadequate graft apposition on the right wall, so I replaced the Q50 over the left wire.  I remolded the top of the graft, holding pressure for 30 seconds.  I deflated the balloon and removed it.  I repeated the completion aortogram: the prior inadequate apposition was resolved.  I replaced the Bentson wire into the pigtail catheter, straightening out the crook in the catheter.  I removed the catheter.  I then placed a BER-2 catheter over the left wire and then exchanged the wire for a Bentson wire.    Completion of Pre-close Repair of Common Femoral Arteries At this point, I gave 40 mg of Protamine to reverse anticoagulation.  I turned my attention to the ipsilateral groin.  Dr. Scot Dock held pressure proximally and distally on this common femoral artery as I removed the femoral sheath.  There was no change in blood pressure with this maneuver.  I then sequentially tightened the previously tagged Proglide sutures.  There was minimal bleeding with the wire still in place, so I removed the wire.  I sequentially tightened the previous tagged Proglide sutures again and locked each set of sutures by applying tension to the locking stitch in each pair.  In this fashion, I repaired the ipsilateral common femoral artery.  This exact same process was completed in the contralateral groin, after removing the sheath on that side.  Hemostats were applied to the sutures in both groin.    After waiting a few minutes, I transected the sutures in both groins.  The skin incisions in both  groin were repaired with U-stitches of 4-0 Monocryl.  The skin was cleaned, dried, and Dermabond used to reinforce the skin closures.  A sterile dressing was applied to both groins.  Completion Distal Pulse Distally, I could doppler on the right: all tibials, and on the left: all tibials.   Adele Barthel, MD, FACS [facility] Office: [phone] Pager: [phone]  07/14/2017, 10:10 AM

## 2017-07-14 NOTE — H&P (View-Only) (Signed)
Established Abdominal Aortic Aneurysm   History of Present Illness   The patient is a 82 y.o. ([birth year]) male who presents with chief complaint: follow up for AAA.  Previous studies demonstrate an AAA, measuring 5.3 cm.  The patient does not have back or abdominal pain.  The patient is not a smoker: quit at 82 y/o  The patient's PMH, PSH, SH, and FamHx are unchanged from 06/03/17.  Current Outpatient Prescriptions  Medication Sig Dispense Refill  . amLODipine (NORVASC) 10 MG tablet Take 10 mg by mouth daily.    Marland Kitchen aspirin 81 MG tablet Take 81 mg by mouth daily.    . bisoprolol-hydrochlorothiazide (ZIAC) 5-6.25 MG per tablet Take 1 tablet by mouth daily.    . fish oil-omega-3 fatty acids 1000 MG capsule Take 1 g by mouth daily.    Marland Kitchen latanoprost (XALATAN) 0.005 % ophthalmic solution Place 1 drop into both eyes at bedtime.    Marland Kitchen lisinopril (PRINIVIL,ZESTRIL) 10 MG tablet     . ranitidine (ZANTAC) 150 MG tablet Take 150 mg by mouth as needed.     . simvastatin (ZOCOR) 20 MG tablet Take 20 mg by mouth at bedtime.    . vardenafil (LEVITRA) 20 MG tablet Take 20 mg by mouth daily as needed.    . vitamin E 400 UNIT capsule Take 400 Units by mouth daily.     No current facility-administered medications for this visit.     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  General Alert, O x 3, WD, NAD  Pulmonary Sym exp, good B air movt, CTA B  Cardiac RRR, Nl S1, S2, no Murmurs, No rubs, No S3,S4  Vascular Vessel Right Left  Radial Palpable Palpable  Brachial Palpable Palpable  Carotid Palpable, No Bruit Palpable, No Bruit  Aorta Not palpable N/A  Femoral Palpable Palpable  Popliteal Not palpable Not palpable  PT Faintly palpable Faintly palpable  DP  Faintly palpable Faintly palpable    Gastro- intestinal soft, non-distended, non-tender to palpation, No guarding or rebound, no HSM, no masses, no CVAT B, No palpable prominent aortic pulse,    Musculo- skeletal M/S 5/5 throughout  , Extremities without ischemic changes  , No edema present, No visible varicosities , No Lipodermatosclerosis present  Neurologic Pain and light touch intact in extremities , Motor exam as listed above    Radiology     Ct Angio Abdomen Pelvis  W &/or Wo Contrast  Result Date: 06/22/2017 CLINICAL DATA:  Abdominal aortic aneurysm, weight loss, prior left nephrectomy EXAM: CT ANGIOGRAPHY ABDOMEN AND PELVIS TECHNIQUE: Multidetector CT imaging of the abdomen and pelvis was performed using the standard protocol during bolus administration of intravenous contrast. Multiplanar reconstructed images including MIPs were obtained and reviewed to evaluate the vascular anatomy. CONTRAST:  75 cc Isovue 370 COMPARISON:  03/24/2012 FINDINGS: Arterial findings: Aorta: Fusiform atherosclerotic infrarenal abdominal aortic aneurysm measures 5.4 cm transverse and 4.8 cm in AP dimension, previously measuring 4.2 x 4.4 cm at a similar level. Distal aspect of the aneurysm is tortuous. No associated dissection or retroperitoneal hemorrhage. No evidence of rupture. Celiac axis: Tortuous atherosclerotic origin but remains patent. Splenic, left gastric, and  hepatic branches are patent. Superior mesenteric:  Mild atherosclerotic change but widely patent. Left renal: Not visualized. Patient is status post previous left nephrectomy Right renal: Atherosclerotic origin but remains patent. No accessory right renal artery. Inferior mesenteric: Origin remains patent off of the infrarenal aneurysm laterally, image 94 series 4. Left iliac: Tortuous aortoiliac bifurcation. Atherosclerotic changes of the iliac system. Left common, internal and external iliac arteries remain patent. Right iliac: Torturous aortic  iliac bifurcation. Right common, internal and external iliac arteries remain patent. Venous findings:      Dedicated venous imaging not performed Review of the MIP images confirms the above findings. Nonvascular findings: Lower chest: Minor dependent bibasilar atelectasis. Normal heart size. No pericardial pleural effusion. Liver, gallbladder, biliary system, pancreas, and spleen are within normal limits for arterial phase imaging. Stable mild nodularity and thickening of the adrenal glands. Previous left nephrectomy. Right kidney demonstrates mild hypertrophy without acute process or obstruction. No focal right renal abnormality. Right ureter is not dilated. No obstructing ureteral calculus. Urinary bladder unremarkable. Negative for bowel obstruction, significant dilatation, ileus, or free air. Normal appendix demonstrated. No fluid collection or abscess. Colonic diverticulosis noted. No acute inflammatory process. No adenopathy. Prostate gland is enlarged impressing upon the bladder base. No significant inguinal abnormality. Intact abdominal wall. No hernia. Degenerative changes of the spine. Mild associated scoliosis. No acute osseous finding. IMPRESSION: 5.4 cm fusiform infrarenal abdominal aortic aneurysm, enlarged since comparison study of 03/24/2012 previously measuring 4.4 cm. Recommend followup by abdomen and pelvis CTA in 3-6 months, and vascular surgery referral/consultation if not already obtained. This recommendation follows ACR consensus guidelines: White Paper of the ACR Incidental Findings Committee II on Vascular Findings. J Am Coll Radiol 2013; 10:789-794. Remote left nephrectomy. Diverticulosis without acute inflammatory process Prostate enlargement Electronically Signed   By: Jerilynn Mages.  Shick M.D.   On: 06/22/2017 09:09    Medical Decision Making   Steven Mccormick is a 82 y.o. (1935-06-14) male who presents with: asymptomatic large AAA with increasing size.   Based on this patient's exam and  diagnostic studies, he needs EVAR.  As part of his preop evaluation, I am going to refer him to Cardiology for preop risk stratification as I suspect Anes will request such.  I emphasized the importance of maximal medical management including strict control of blood pressure, blood glucose, and lipid levels, antiplatelet agents, obtaining regular exercise, and cessation of smoking.    I will have him follow up in 2 weeks for procedure counseling.  Thank you for allowing [place] to participate in this patient's care.   Adele Barthel, MD, FACS [facility] Office: [phone] Pager: [phone]

## 2017-07-14 NOTE — Anesthesia Procedure Notes (Signed)
Arterial Line Insertion Start/End8/01/2017 7:00 AM, 07/14/2017 7:10 AM Performed by: Jeanella Craze, CRNA  Patient location: Pre-op. Preanesthetic checklist: patient identified, IV checked, site marked, risks and benefits discussed, surgical consent, monitors and equipment checked, pre-op evaluation and timeout performed Lidocaine 1% used for infiltration Right, radial was placed Catheter size: 20 G Hand hygiene performed  and maximum sterile barriers used  Allen's test indicative of satisfactory collateral circulation Attempts: 1 Procedure performed without using ultrasound guided technique. Following insertion, dressing applied. Post procedure assessment: normal  Patient tolerated the procedure well with no immediate complications.

## 2017-07-14 NOTE — Interval H&P Note (Signed)
   History and Physical Update  The patient was interviewed and re-examined.  The patient's previous History and Physical has been reviewed and is unchanged from my consult except for: interval cardiology evaluation.  This patient's nuclear stress test was normal.   There is no change in the plan of care: EVAR.   The patient is aware the risks of endovascular aortic surgery include but are not limited to: bleeding, need for transfusion, infection, death, stroke, paralysis, wound complications, spinal, pelvic and bowel ischemia, extended ventilation, anaphylactic reaction to contrast, contrast induced nephropathy, embolism, and need for additional procedure to address endoleaks.    Overall, I cited a mortality rate of 1-2% and morbidity rate of 15%.   Adele Barthel, MD, FACS [facility] [place] Office: [phone] Pager: [phone]  07/14/2017, 7:00 AM

## 2017-07-14 NOTE — Transfer of Care (Signed)
Immediate Anesthesia Transfer of Care Note  Patient: Steven Mccormick  Procedure(s) Performed: Procedure(s): ABDOMINAL AORTIC ENDOVASCULAR STENT GRAFT (N/A)  Patient Location: PACU  Anesthesia Type:General  Level of Consciousness: awake, alert , oriented and sedated  Airway & Oxygen Therapy: Patient Spontanous Breathing and Patient connected to nasal cannula oxygen  Post-op Assessment: Report given to RN, Post -op Vital signs reviewed and stable and Patient moving all extremities  Post vital signs: Reviewed and stable  Last Vitals:  Vitals:   07/14/17 0623  BP: 138/66  Pulse: 64  Resp: 16  Temp: 36.8 C    Last Pain:  Vitals:   07/14/17 0623  TempSrc: Oral      Patients Stated Pain Goal: 4 (58/85/02 7741)  Complications: No apparent anesthesia complications

## 2017-07-14 NOTE — Anesthesia Postprocedure Evaluation (Signed)
Anesthesia Post Note  Patient: Steven Mccormick  Procedure(s) Performed: Procedure(s) (LRB): ABDOMINAL AORTIC ENDOVASCULAR STENT GRAFT (N/A)     Patient location during evaluation: PACU Anesthesia Type: General Level of consciousness: awake, awake and alert and oriented Pain management: pain level controlled Vital Signs Assessment: post-procedure vital signs reviewed and stable Respiratory status: spontaneous breathing, nonlabored ventilation and respiratory function stable Cardiovascular status: blood pressure returned to baseline Anesthetic complications: no    Last Vitals:  Vitals:   07/14/17 1130 07/14/17 1145  BP:    Pulse: (!) 54 (!) 57  Resp: 15 19  Temp:      Last Pain:  Vitals:   07/14/17 1120  TempSrc:   PainSc: 0-No pain                 Dayson Aboud COKER

## 2017-07-14 NOTE — Anesthesia Preprocedure Evaluation (Addendum)
Anesthesia Evaluation  Patient identified by MRN, date of birth, ID band Patient awake    Reviewed: Allergy & Precautions, NPO status , Patient's Chart, lab work & pertinent test results  Airway Mallampati: II  TM Distance: >3 FB Neck ROM: Full    Dental  (+) Dental Advisory Given, Edentulous Upper   Pulmonary former smoker,    breath sounds clear to auscultation       Cardiovascular hypertension,  Rhythm:Regular Rate:Normal     Neuro/Psych    GI/Hepatic   Endo/Other    Renal/GU      Musculoskeletal   Abdominal   Peds  Hematology   Anesthesia Other Findings   Reproductive/Obstetrics                            Anesthesia Physical Anesthesia Plan  ASA: III  Anesthesia Plan: General   Post-op Pain Management:    Induction: Intravenous  PONV Risk Score and Plan: Ondansetron and Dexamethasone  Airway Management Planned: Oral ETT  Additional Equipment: CVP and Arterial line  Intra-op Plan:   Post-operative Plan: Extubation in OR  Informed Consent: I have reviewed the patients History and Physical, chart, labs and discussed the procedure including the risks, benefits and alternatives for the proposed anesthesia with the patient or authorized representative who has indicated his/her understanding and acceptance.   Dental advisory given  Plan Discussed with: CRNA and Anesthesiologist  Anesthesia Plan Comments:         Anesthesia Quick Evaluation

## 2017-07-14 NOTE — Op Note (Signed)
    NAME: Steven Mccormick    MRN: [id number] DOB: 05-09-1935    DATE OF OPERATION: 07/14/2017  PREOP DIAGNOSIS:    Abdominal aortic aneurysm  POSTOP DIAGNOSIS:    Same  PROCEDURE:    1. Pre-closure of right common femoral artery 2. Placement of contralateral iliac limb (16 mm I 13.5 cm Gore device)  CO-SURGEON's: Judeth Cornfield. Scot Dock, MD, Adele Barthel, MD  ANESTHESIA: Gen.   EBL: Minimal  INDICATIONS:    Steven Mccormick is a 82 y.o. male who presented with an enlarging abdominal aortic aneurysm. He presents for elective repair.  FINDINGS:    The right common femoral artery did have some large posterior plaque and some calcium in the wall. We were able to successfully pre-close the right common femoral artery however. Completion film showed an excellent result with no type I or type II endoleak.  TECHNIQUE:    The patient was taken to the operating room and received a general anesthetic. Monitoring lines had been placed by anesthesia. The abdomen and groins were prepped and draped in the usual sterile fashion. On the right side, the right common femoral artery was identified under duplex and an area of the artery where there was not significant anterior plaque was identified. A small incision was made with an 11 blade and the soft tissue was spread. The right common femoral artery was cannulated and a guidewire introduced into the distal aorta under fluoroscopic control. Initial Perclose device was placed over the wire and rotated 15 medially. This was deployed without difficulty. The tract had been dilated with an 8 [place] dilator. The second Perclose device was rotated 15 laterally. However on 2 occasions the suture broke and it required a third Perclose which was successful and the artery was successfully pre-closed. Long 8 French sheath was placed in the right groin. This was somewhat poorly changed to a 12 [place] sheath over an Amplatz wire.  After the trunk ipsilateral component  was placed, we did use a buddy wire technique. The Amplatz wire was left in place and then the 12 French sheath cannulated and a Benson wire past below the contralateral gate. Using a Berenstein catheter a wire was directed into the contralateral gate and the catheter advanced into the trunk in situ component. An Amplatz wire was placed and then a pigtail catheter placed. The wire was retracted and the pigtail catheter rotated within the main body to confirm intraluminal position. The Amplatz wire was advanced. A retrograde iliac arterial obtained and a LAO projection to demonstrate the position of the hypogastric artery. A 16 mm x 13.5 cm contralateral limb was selected. This was advanced through the 12 [place] sheath on the right to the contralateral gate staying above the hypogastric artery. This was deployed without difficulty.   At the completion of the procedure the overlap regions and distal contralateral limb was successfully ballooned with a Q-50 balloon. The [facility] wire had been placed up the right side and exchanged for an Amplatz wire.  Medical completion of the procedure, the sheath was removed and the previously placed Perclose sutures were secured with good hemostasis. Sutures were then cut and the skin closed with 4-0 Monocryl. Dermabond was applied. Patient tolerated procedure well was transferred to the recovery room in stable condition. All needle and sponge counts were correct.   Steven Mayo, MD, FACS [facility]  DATE OF DICTATION:   07/14/2017

## 2017-07-14 NOTE — Anesthesia Procedure Notes (Signed)
Procedures

## 2017-07-15 ENCOUNTER — Encounter (HOSPITAL_COMMUNITY): Payer: Self-pay | Admitting: Vascular Surgery

## 2017-07-15 LAB — CBC
HCT: 31 % — ABNORMAL LOW (ref 39.0–52.0)
HEMOGLOBIN: 10.5 g/dL — AB (ref 13.0–17.0)
MCH: 32.1 pg (ref 26.0–34.0)
MCHC: 33.9 g/dL (ref 30.0–36.0)
MCV: 94.8 fL (ref 78.0–100.0)
PLATELETS: 174 10*3/uL (ref 150–400)
RBC: 3.27 MIL/uL — AB (ref 4.22–5.81)
RDW: 12.5 % (ref 11.5–15.5)
WBC: 11.2 10*3/uL — ABNORMAL HIGH (ref 4.0–10.5)

## 2017-07-15 LAB — BASIC METABOLIC PANEL
Anion gap: 9 (ref 5–15)
BUN: 18 mg/dL (ref 6–20)
CHLORIDE: 105 mmol/L (ref 101–111)
CO2: 23 mmol/L (ref 22–32)
CREATININE: 1.08 mg/dL (ref 0.61–1.24)
Calcium: 8.7 mg/dL — ABNORMAL LOW (ref 8.9–10.3)
Glucose, Bld: 168 mg/dL — ABNORMAL HIGH (ref 65–99)
Potassium: 4.2 mmol/L (ref 3.5–5.1)
SODIUM: 137 mmol/L (ref 135–145)

## 2017-07-15 MED ORDER — OXYCODONE-ACETAMINOPHEN 5-325 MG PO TABS: 1.0000 | ORAL_TABLET | Freq: Four times a day (QID) | ORAL | 0 refills | Status: DC | PRN

## 2017-07-15 NOTE — Progress Notes (Addendum)
[facility] [place]  Subjective  - Doing very well and very pleased.   Objective 137/84 (!) 57 97.8 F (36.6 C) (Oral) 14 96%  Intake/Output Summary (Last 24 hours) at 07/15/17 0734 Last data filed at 07/15/17 0640  Gross per 24 hour  Intake             3390 ml  Output             2350 ml  Net             1040 ml    Groins soft Abdomen soft, palpable pedal pulses  Assessment/Planning: POD # 1 EVAR  Plan for discharge once he has voided. F/U with Dr. Bridgett Larsson in 4 weeks with CTA of AB/pelvis  Laurence Slate [facility] 07/15/2017 7:34 AM   Addendum  I have independently interviewed and examined the patient, and I agree with the physician assistant's findings.  B groins without hematoma or echymosis.  Palpable B DP pulses.  Foley removed this AM.    - Ok to D/C once pt able to urinate.  Pt has known history of BPH on Flomax - F/U 4 weeks with CTA abd/pelvis  Adele Barthel, MD, FACS [facility] [place]: [phone] Pager: [phone]  07/15/2017, 8:05 AM   --  Laboratory Lab Results:  Recent Labs  07/14/17 2133 07/15/17 0522  WBC 8.9 11.2*  HGB 11.2* 10.5*  HCT 33.9* 31.0*  PLT 188 174   BMET  Recent Labs  07/12/17 1413 07/14/17 2133 07/15/17 0522  NA 137  --  137  K 4.2  --  4.2  CL 104  --  105  CO2 24  --  23  GLUCOSE 118*  --  168*  BUN 22*  --  18  CREATININE 0.99 1.26* 1.08  CALCIUM 9.2  --  8.7*    COAG Lab Results  Component Value Date   INR 1.11 07/12/2017   No results found for: PTT

## 2017-07-15 NOTE — Addendum Note (Signed)
Addended by: Lianne Cure A on: 07/15/2017 09:10 AM   Modules accepted: Orders

## 2017-07-15 NOTE — Care Management Note (Signed)
Case Management Note Marvetta Gibbons RN, BSN Unit 4E-Case Manager [phone]  Patient Details  Name: Steven Mccormick MRN: [id number] Date of Birth: November 03, 1935  Subjective/Objective:  Pt admitted s/p EVAR                  Action/Plan: PTA pt lived at home with spouse- plan to d/c home- no CM noted for discharge.   Expected Discharge Date:  07/15/17               Expected Discharge Plan:  Home/Self Care  In-House Referral:  NA  Discharge planning Services  CM Consult  Post Acute Care Choice:  NA Choice offered to:  NA  DME Arranged:    DME Agency:     HH Arranged:    HH Agency:     Status of Service:  Completed, signed off  If discussed at [place] of Stay Meetings, dates discussed:    Discharge Disposition: home/self care  Additional Comments:  Dawayne Patricia, RN 07/15/2017, 11:09 AM

## 2017-07-15 NOTE — Progress Notes (Signed)
Arterial line discontinued as ordered.Pressure dressing applied to right radial area.Will continue to monitor.

## 2017-07-15 NOTE — Progress Notes (Addendum)
All discharge instructions reviewed in detail with pt & pt's daughter with understanding verbalized by both.  No questions or concerns prior to discharge.  VSS.  Bilateral groin sites level 0.  Discharged to home with daughter in stable condition.

## 2017-07-16 DIAGNOSIS — R278 Other lack of coordination: Secondary | ICD-10-CM | POA: Diagnosis not present

## 2017-07-16 DIAGNOSIS — I714 Abdominal aortic aneurysm, without rupture: Secondary | ICD-10-CM | POA: Diagnosis not present

## 2017-07-16 DIAGNOSIS — Z48812 Encounter for surgical aftercare following surgery on the circulatory system: Secondary | ICD-10-CM | POA: Diagnosis not present

## 2017-07-16 DIAGNOSIS — I10 Essential (primary) hypertension: Secondary | ICD-10-CM | POA: Diagnosis not present

## 2017-07-16 DIAGNOSIS — R262 Difficulty in walking, not elsewhere classified: Secondary | ICD-10-CM | POA: Diagnosis not present

## 2017-07-16 DIAGNOSIS — R0989 Other specified symptoms and signs involving the circulatory and respiratory systems: Secondary | ICD-10-CM | POA: Diagnosis not present

## 2017-07-18 DIAGNOSIS — I714 Abdominal aortic aneurysm, without rupture: Secondary | ICD-10-CM | POA: Diagnosis not present

## 2017-07-18 DIAGNOSIS — R0989 Other specified symptoms and signs involving the circulatory and respiratory systems: Secondary | ICD-10-CM | POA: Diagnosis not present

## 2017-07-18 DIAGNOSIS — I10 Essential (primary) hypertension: Secondary | ICD-10-CM | POA: Diagnosis not present

## 2017-07-18 DIAGNOSIS — R278 Other lack of coordination: Secondary | ICD-10-CM | POA: Diagnosis not present

## 2017-07-18 DIAGNOSIS — R262 Difficulty in walking, not elsewhere classified: Secondary | ICD-10-CM | POA: Diagnosis not present

## 2017-07-18 DIAGNOSIS — Z48812 Encounter for surgical aftercare following surgery on the circulatory system: Secondary | ICD-10-CM | POA: Diagnosis not present

## 2017-07-18 LAB — TYPE AND SCREEN
ABO/RH(D): O POS
Antibody Screen: NEGATIVE
UNIT DIVISION: 0
Unit division: 0

## 2017-07-18 LAB — BPAM RBC
BLOOD PRODUCT EXPIRATION DATE: 201808292359
Blood Product Expiration Date: 201808292359
UNIT TYPE AND RH: 5100
Unit Type and Rh: 5100

## 2017-07-19 ENCOUNTER — Telehealth: Payer: Self-pay | Admitting: Vascular Surgery

## 2017-07-19 DIAGNOSIS — Z8679 Personal history of other diseases of the circulatory system: Secondary | ICD-10-CM | POA: Diagnosis not present

## 2017-07-19 DIAGNOSIS — Z9889 Other specified postprocedural states: Secondary | ICD-10-CM | POA: Diagnosis not present

## 2017-07-19 DIAGNOSIS — R300 Dysuria: Secondary | ICD-10-CM | POA: Diagnosis not present

## 2017-07-19 DIAGNOSIS — Z9189 Other specified personal risk factors, not elsewhere classified: Secondary | ICD-10-CM | POA: Diagnosis not present

## 2017-07-19 NOTE — Telephone Encounter (Signed)
Sched CTA 08/08/17 at 9:00 at [place]. Sched MD 08/19/17 at 8:45. Spoke to pt.

## 2017-07-19 NOTE — Telephone Encounter (Signed)
-----   Message from Mena Goes, RN sent at 07/15/2017  8:15 AM EDT ----- Regarding: 4 weeks w/ CTA abd pelvis   ----- Message ----- From: Ulyses Amor, PA-C Sent: 07/15/2017   7:38 AM To: Vvs Charge Pool  F/U with Dr. Bridgett Larsson in 4 weeks CTA Ab/pelvis s/p EVAR

## 2017-07-20 DIAGNOSIS — R278 Other lack of coordination: Secondary | ICD-10-CM | POA: Diagnosis not present

## 2017-07-20 DIAGNOSIS — I714 Abdominal aortic aneurysm, without rupture: Secondary | ICD-10-CM | POA: Diagnosis not present

## 2017-07-20 DIAGNOSIS — R262 Difficulty in walking, not elsewhere classified: Secondary | ICD-10-CM | POA: Diagnosis not present

## 2017-07-20 DIAGNOSIS — Z48812 Encounter for surgical aftercare following surgery on the circulatory system: Secondary | ICD-10-CM | POA: Diagnosis not present

## 2017-07-20 DIAGNOSIS — I10 Essential (primary) hypertension: Secondary | ICD-10-CM | POA: Diagnosis not present

## 2017-07-20 DIAGNOSIS — R0989 Other specified symptoms and signs involving the circulatory and respiratory systems: Secondary | ICD-10-CM | POA: Diagnosis not present

## 2017-07-22 ENCOUNTER — Ambulatory Visit: Payer: Medicare Other | Admitting: Vascular Surgery

## 2017-07-22 DIAGNOSIS — I714 Abdominal aortic aneurysm, without rupture: Secondary | ICD-10-CM | POA: Diagnosis not present

## 2017-07-22 DIAGNOSIS — R278 Other lack of coordination: Secondary | ICD-10-CM | POA: Diagnosis not present

## 2017-07-22 DIAGNOSIS — R0989 Other specified symptoms and signs involving the circulatory and respiratory systems: Secondary | ICD-10-CM | POA: Diagnosis not present

## 2017-07-22 DIAGNOSIS — Z48812 Encounter for surgical aftercare following surgery on the circulatory system: Secondary | ICD-10-CM | POA: Diagnosis not present

## 2017-07-22 DIAGNOSIS — R262 Difficulty in walking, not elsewhere classified: Secondary | ICD-10-CM | POA: Diagnosis not present

## 2017-07-22 DIAGNOSIS — I10 Essential (primary) hypertension: Secondary | ICD-10-CM | POA: Diagnosis not present

## 2017-07-25 NOTE — Discharge Summary (Signed)
[facility] Discharge Summary   Patient ID:  Steven Mccormick MRN: [id number] DOB/AGE: 01-17-1935 82 y.o.  Admit date: 07/14/2017 Discharge date: 07/25/2017 Date of Surgery: 07/14/2017 Surgeon: Surgeon(s): Steven Crabtree, MD Steven Mould, MD  Admission Diagnosis: Abdominal Aortic Aneurysm  I71.4  Discharge Diagnoses:  Abdominal Aortic Aneurysm  I71.4  Secondary Diagnoses: Past Medical History:  Diagnosis Date  . AAA (abdominal aortic aneurysm) ([place])   . Abdominal aneurysm ([place])   . ED (erectile dysfunction)   . GERD (gastroesophageal reflux disease)   . Hyperlipidemia   . Hypertension   . Palpitations     Procedure(s): ABDOMINAL AORTIC ENDOVASCULAR STENT GRAFT  Discharged Condition: good  HPI: The patient is a 82 y.o. (01/24/1935) male who presents with chief complaint: follow up for AAA.  Studies demonstrate an AAA, asymmetrically enlarging now measuring 5.3 cm.  He is asymmetric without back or belly pain.   Hospital Course:  Steven Mccormick is a 82 y.o. male is S/P  Procedure(s): ABDOMINAL AORTIC ENDOVASCULAR STENT GRAFT  Groins soft Abdomen soft, palpable pedal pulses  Assessment/Planning: POD # 1 EVAR  Plan for discharge in stable condition. F/U with Dr. Bridgett Mccormick in 4 weeks with CTA of AB/pelvis    Significant Diagnostic Studies: CBC Lab Results  Component Value Date   WBC 11.2 (H) 07/15/2017   HGB 10.5 (L) 07/15/2017   HCT 31.0 (L) 07/15/2017   MCV 94.8 07/15/2017   PLT 174 07/15/2017    BMET    Component Value Date/Time   NA 137 07/15/2017 0522   K 4.2 07/15/2017 0522   CL 105 07/15/2017 0522   CO2 23 07/15/2017 0522   GLUCOSE 168 (H) 07/15/2017 0522   BUN 18 07/15/2017 0522   CREATININE 1.08 07/15/2017 0522   CALCIUM 8.7 (L) 07/15/2017 0522   GFRNONAA >60 07/15/2017 0522   GFRAA >60 07/15/2017 0522   COAG Lab Results  Component Value Date   INR 1.11 07/12/2017     Disposition:  Discharge to :Home Discharge  Instructions    Call MD for:  redness, tenderness, or signs of infection (pain, swelling, bleeding, redness, odor or green/yellow discharge around incision site)    Complete by:  As directed    Call MD for:  severe or increased pain, loss or decreased feeling  in affected limb(s)    Complete by:  As directed    Call MD for:  temperature >100.5    Complete by:  As directed    Discharge instructions    Complete by:  As directed    You may remove dressings and shower   Driving Restrictions    Complete by:  As directed    No driving for 1 week   Increase activity slowly    Complete by:  As directed    Walk with assistance use walker or cane as needed   Lifting restrictions    Complete by:  As directed    No heavy lifting for 3-4 weeks   Resume previous diet    Complete by:  As directed      Allergies as of 07/15/2017   No Known Allergies     Medication List    TAKE these medications   amLODipine 10 MG tablet Commonly known as:  NORVASC Take 10 mg by mouth daily.   aspirin 81 MG tablet Take 81 mg by mouth daily.   B-12 5000 MCG Caps Take 5,000 mcg by mouth daily.   bisoprolol-hydrochlorothiazide 5-6.25 MG  tablet Commonly known as:  ZIAC Take 1 tablet by mouth daily.   latanoprost 0.005 % ophthalmic solution Commonly known as:  XALATAN Place 1 drop into both eyes at bedtime.   lisinopril 10 MG tablet Commonly known as:  PRINIVIL,ZESTRIL Take 10 mg by mouth daily.   oxyCODONE-acetaminophen 5-325 MG tablet Commonly known as:  PERCOCET/ROXICET Take 1 tablet by mouth every 6 (six) hours as needed.   ranitidine 150 MG tablet Commonly known as:  ZANTAC Take 150 mg by mouth daily as needed for heartburn.   simvastatin 20 MG tablet Commonly known as:  ZOCOR Take 20 mg by mouth daily.   tamsulosin 0.4 MG Caps capsule Commonly known as:  FLOMAX Take 0.4 mg by mouth at bedtime.   vitamin E 400 UNIT capsule Take 400 Units by mouth daily.      Verbal and written  Discharge instructions given to the patient. Wound care per Discharge AVS Follow-up Information    Steven , MD Follow up in 4 week(s).   Specialties:  Vascular Surgery, Cardiology Why:  office will call Contact information: [place] [address] [phone]           Signed: Laurence Mccormick [facility] 07/25/2017, 2:37 PM  Addendum  I have independently interviewed and examined the patient, and I agree with the physician assistant's discharge summary.  This patient underwent a pEVAR, only remarkable for heavily calcified common femoral artery which resulted in repeated Proglide use due to fracturing of the suture.  Otherwise the EVAR was unremarkable with aneurysm exclusion without endoleak.  His post-operative recovery was also unremarkable and the patient discharged on POD #1.  He will follow up in the office in 4 weeks with CTA abd/pelvis to evaluate position of the EVAR.   Steven Barthel, MD, FACS [facility] [place] Office: [phone] Pager: [phone]  07/26/2017, 10:51 AM    - For VQI Registry use --- Instructions: Press F2 to tab through selections.  Delete question if not applicable.   Post-op:  Time to Extubation: [x]  In OR, [ ]  < 12 hrs, [ ]  12-24 hrs, [ ]  >=24 hrs Vasopressors Req. Post-op: No MI: [ ]  No, [ ]  Troponin only, [ ]  EKG or Clinical New Arrhythmia: No CHF: No ICU Stay: 0 days Transfusion: No  If yes, 0 units given  Complications: Resp failure: [x ] none, [ ]  Pneumonia, [ ]  Ventilator Chg in renal function: [x ] none, [ ]  Inc. Cr > 0.5, [ ]  Temp. Dialysis, [ ]  Permanent dialysis Leg ischemia: [x ] No, [ ]  Yes, no Surgery needed, [ ]  Yes, Surgery needed, [ ]  Amputation Bowel ischemia: [x ] No, [ ]  Medical Rx, [ ]  Surgical Rx Wound complication: [x ] No, [ ]  Superficial separation/infection, [ ]  Return to OR Return to OR: No  Return to OR for bleeding: No Stroke: x[ ]  None, [ ]  Minor, [ ]  Major  Discharge  medications: Statin use:  Yes ASA use:  Yes Plavix use:  No  for medical reason   Beta blocker use:  No  for medical reason

## 2017-08-08 ENCOUNTER — Ambulatory Visit
Admission: RE | Admit: 2017-08-08 | Discharge: 2017-08-08 | Disposition: A | Discharge status: Home / Self Care | Payer: Medicare Other | Source: Ambulatory Visit | Attending: Vascular Surgery | Admitting: Vascular Surgery

## 2017-08-08 DIAGNOSIS — K5730 Diverticulosis of large intestine without perforation or abscess without bleeding: Secondary | ICD-10-CM | POA: Diagnosis not present

## 2017-08-08 DIAGNOSIS — I714 Abdominal aortic aneurysm, without rupture, unspecified: Secondary | ICD-10-CM

## 2017-08-08 MED ORDER — IOPAMIDOL (ISOVUE-370) INJECTION 76%
75.0000 mL | Freq: Once | INTRAVENOUS | Status: AC | PRN
  Administered 2017-08-08: 75 mL via INTRAVENOUS

## 2017-08-16 NOTE — Progress Notes (Signed)
    Post-operative EVAR   History of Present Illness   Steven Mccormick is a 82 y.o. male who presents post-op s/p EVAR (07/14/17).  Most recent CTA (08/08/17) demonstrates: small endoleak and stable sac size.  The patient has bi had back or abdominal pain.   For VQI Use Only   PRE-ADM LIVING: Home  AMB STATUS: Ambulatory   Physical Examination   Vitals:   08/19/17 0827  BP: 124/70  Pulse: (!) 56  Resp: 16  Temp: (!) 97.2 F (36.2 C)  SpO2: 100%  Weight: 133 lb (60.3 kg)  Height: 5\' 8"  (1.727 m)    Vascular Vessel Right Left  Aorta Not palpable N/A  Femoral Palpable Palpable    Gastrointestinal soft, non-distended, non-tender to palpation, No guarding or rebound, no HSM, no masses, no CVAT B, No palpable prominent aortic pulse,      Radiology   CTA Abd & Pelvis (08/08/17) Interval stent graft repair of the atherosclerotic fusiform abdominal aortic aneurysm. Small left anterolateral type 2 endoleak secondary to a patent small inferior mesenteric artery. Stable aneurysm sac diameter. Measurements as above.  Based on my review of this patient's CTA, the main body is in good position in the immediate infrarenal position.  Renals appear patent.  Both limbs appear patent.  Pt has small type 2 endoleak, but the aortic sac is completely thrombosed.  I don't expect any sac shrinkage on this CTA at 1 month post-op.   Medical Decision Making   Steven Mccormick is a 82 y.o. male who presents s/p EVAR with small type 2 endoleak from IMA    Pt is asymptomatic with stable sac size.  I expect this endoleak will eventually thrombose as there isn't anywhere for the flow to go as the sac is essentially completely thrombosed at this point.  I discussed with the patient the importance of surveillance of the endograft.  The next endograft duplex will be scheduled for 6 months.  The next CTA will be scheduled for 12 months.  The patient will follow up at the above  intervals.  Thank you for allowing [place] to participate in this patient's care.   Adele Barthel, MD, FACS [facility] [place] Office: [phone] Pager: [phone]

## 2017-08-19 ENCOUNTER — Encounter: Payer: Self-pay | Admitting: Vascular Surgery

## 2017-08-19 ENCOUNTER — Ambulatory Visit (INDEPENDENT_AMBULATORY_CARE_PROVIDER_SITE_OTHER): Payer: Self-pay | Admitting: Vascular Surgery

## 2017-08-19 VITALS — BP 124/70 | HR 56 | Temp 97.2°F | Resp 16 | Ht 68.0 in | Wt 133.0 lb

## 2017-08-19 DIAGNOSIS — I714 Abdominal aortic aneurysm, without rupture, unspecified: Secondary | ICD-10-CM

## 2017-08-26 NOTE — Addendum Note (Signed)
Addended by: Lianne Cure A on: 08/26/2017 04:16 PM   Modules accepted: Orders

## 2017-09-19 DIAGNOSIS — H401132 Primary open-angle glaucoma, bilateral, moderate stage: Secondary | ICD-10-CM | POA: Diagnosis not present

## 2017-09-29 DIAGNOSIS — L570 Actinic keratosis: Secondary | ICD-10-CM | POA: Diagnosis not present

## 2017-09-29 DIAGNOSIS — L853 Xerosis cutis: Secondary | ICD-10-CM | POA: Diagnosis not present

## 2017-09-29 DIAGNOSIS — L812 Freckles: Secondary | ICD-10-CM | POA: Diagnosis not present

## 2017-09-29 DIAGNOSIS — L821 Other seborrheic keratosis: Secondary | ICD-10-CM | POA: Diagnosis not present

## 2017-09-29 DIAGNOSIS — Z85828 Personal history of other malignant neoplasm of skin: Secondary | ICD-10-CM | POA: Diagnosis not present

## 2017-09-29 DIAGNOSIS — D1801 Hemangioma of skin and subcutaneous tissue: Secondary | ICD-10-CM | POA: Diagnosis not present

## 2017-10-03 DIAGNOSIS — Z125 Encounter for screening for malignant neoplasm of prostate: Secondary | ICD-10-CM | POA: Diagnosis not present

## 2017-10-03 DIAGNOSIS — R972 Elevated prostate specific antigen [PSA]: Secondary | ICD-10-CM | POA: Diagnosis not present

## 2017-10-13 DIAGNOSIS — R972 Elevated prostate specific antigen [PSA]: Secondary | ICD-10-CM | POA: Diagnosis not present

## 2017-10-13 DIAGNOSIS — R339 Retention of urine, unspecified: Secondary | ICD-10-CM | POA: Diagnosis not present

## 2017-10-25 DIAGNOSIS — H401132 Primary open-angle glaucoma, bilateral, moderate stage: Secondary | ICD-10-CM | POA: Diagnosis not present

## 2017-11-30 DIAGNOSIS — Z23 Encounter for immunization: Secondary | ICD-10-CM | POA: Diagnosis not present

## 2017-12-22 DIAGNOSIS — H401132 Primary open-angle glaucoma, bilateral, moderate stage: Secondary | ICD-10-CM | POA: Diagnosis not present

## 2017-12-22 DIAGNOSIS — H2513 Age-related nuclear cataract, bilateral: Secondary | ICD-10-CM | POA: Diagnosis not present

## 2018-03-01 ENCOUNTER — Ambulatory Visit (INDEPENDENT_AMBULATORY_CARE_PROVIDER_SITE_OTHER): Payer: Medicare Other | Admitting: Cardiovascular Disease

## 2018-03-01 ENCOUNTER — Encounter: Payer: Self-pay | Admitting: Cardiovascular Disease

## 2018-03-01 DIAGNOSIS — E785 Hyperlipidemia, unspecified: Secondary | ICD-10-CM | POA: Insufficient documentation

## 2018-03-01 DIAGNOSIS — I10 Essential (primary) hypertension: Secondary | ICD-10-CM | POA: Diagnosis not present

## 2018-03-01 DIAGNOSIS — E7800 Pure hypercholesterolemia, unspecified: Secondary | ICD-10-CM

## 2018-03-01 NOTE — Assessment & Plan Note (Signed)
Status post endoluminal stent grafting by Dr. Scot Dock 07/14/17 which they follow

## 2018-03-01 NOTE — Assessment & Plan Note (Signed)
History of essential hypertension blood pressure was 147/67. He is on amlodipine and lisinopril. Continue current meds at current dosing.

## 2018-03-01 NOTE — Assessment & Plan Note (Signed)
istory of hyperlipidemia on statin therapy followed by his PCP

## 2018-03-01 NOTE — Progress Notes (Signed)
03/01/2018 Steven Mccormick Read   17-Nov-1935  [id number]  Primary Physician Street, [name], MD Primary Cardiologist: Lorretta Harp MD FACP, [place], [place], [place]  HPI:  Steven Mccormick is a 83 y.o. in and fit-appearing married Caucasian male father of 2, grandfather of 3 grandchildren who was initially seen by Steven Deforest PA-C  for preoperative evaluation 07/07/17. His primary care provider is Dr. Christa See in [place]. He has a history of treated hypertension and hyperlipidemia. He had an abdominal aortic aneurysm and underwent endoluminal stent grafting by Dr. Scot Dock and Bridgett Larsson  06/15/24 without complication.he had a screening Myoview stress test performed 07/08/17 which was entirely normal. He denies chest pain or shortness of breath. Never had a heart attack or stroke.   Current Meds  Medication Sig  . Acetaminophen (ACETAMIN PO) Take by mouth as needed.  Marland Kitchen amLODipine (NORVASC) 10 MG tablet Take 10 mg by mouth daily.  Marland Kitchen aspirin 81 MG tablet Take 81 mg by mouth daily.  . bisoprolol-hydrochlorothiazide (ZIAC) 5-6.25 MG per tablet Take 1 tablet by mouth daily.  . Buchu-Cornsilk-Ch Grass-Hydran (HYDRO-TABS PO) Take by mouth daily.  . ciprofloxacin (CIPRO) 500 MG tablet   . Cyanocobalamin (B-12) 5000 MCG CAPS Take 5,000 mcg by mouth daily.  Marland Kitchen latanoprost (XALATAN) 0.005 % ophthalmic solution Place 1 drop into both eyes at bedtime.  Marland Kitchen lisinopril (PRINIVIL,ZESTRIL) 10 MG tablet Take 10 mg by mouth daily.   Marland Kitchen oxyCODONE-acetaminophen (PERCOCET/ROXICET) 5-325 MG tablet Take 1 tablet by mouth every 6 (six) hours as needed.  . ranitidine (ZANTAC) 150 MG tablet Take 150 mg by mouth daily as needed for heartburn.   . simvastatin (ZOCOR) 20 MG tablet Take 20 mg by mouth daily.   . tamsulosin (FLOMAX) 0.4 MG CAPS capsule Take 0.4 mg by mouth at bedtime.  . vitamin E 400 UNIT capsule Take 400 Units by mouth daily.     No Known Allergies  Social History   Socioeconomic History  . Marital  status: Married    Spouse name: Not on file  . Number of children: Not on file  . Years of education: Not on file  . Highest education level: Not on file  Social Needs  . Financial resource strain: Not on file  . Food insecurity - worry: Not on file  . Food insecurity - inability: Not on file  . Transportation needs - medical: Not on file  . Transportation needs - non-medical: Not on file  Occupational History  . Not on file  Tobacco Use  . Smoking status: Former Smoker    Last attempt to quit: 02/17/1992    Years since quitting: 26.0  . Smokeless tobacco: Never Used  Substance and Sexual Activity  . Alcohol use: Yes    Alcohol/week: 0.0 oz    Comment: Occasional use  . Drug use: No  . Sexual activity: Not on file  Other Topics Concern  . Not on file  Social History Narrative  . Not on file     Review of Systems: General: negative for chills, fever, night sweats or weight changes.  Cardiovascular: negative for chest pain, dyspnea on exertion, edema, orthopnea, palpitations, paroxysmal nocturnal dyspnea or shortness of breath Dermatological: negative for rash Respiratory: negative for cough or wheezing Urologic: negative for hematuria Abdominal: negative for nausea, vomiting, diarrhea, bright red blood per rectum, melena, or hematemesis Neurologic: negative for visual changes, syncope, or dizziness All other systems reviewed and are otherwise negative except as noted above.  Blood pressure (!) 147/67, pulse (!) 56, height 5\' 8"  (1.727 m), weight 139 lb 3.2 oz (63.1 kg).  General appearance: alert and no distress Neck: no adenopathy, no carotid bruit, no JVD, supple, symmetrical, trachea midline and thyroid not enlarged, symmetric, no tenderness/mass/nodules Lungs: clear to auscultation bilaterally Heart: regular rate and rhythm, S1, S2 normal, no murmur, click, rub or gallop Extremities: extremities normal, atraumatic, no cyanosis or edema Pulses: 2+ and  symmetric Skin: Skin color, texture, turgor normal. No rashes or lesions Neurologic: Alert and oriented X 3, normal strength and tone. Normal symmetric reflexes. Normal coordination and gait  EKG not performed today  ASSESSMENT AND PLAN:   Abdominal aneurysm without mention of rupture Status post endoluminal stent grafting by Dr. Scot Dock 07/14/17 which they follow  Essential hypertension History of essential hypertension blood pressure was 147/67. He is on amlodipine and lisinopril. Continue current meds at current dosing.  Hyperlipidemia istory of hyperlipidemia on statin therapy followed by his PCP      Lorretta Harp MD [facility], [facility] 03/01/2018 9:42 AM

## 2018-03-01 NOTE — Patient Instructions (Signed)
Medication Instructions: Your physician recommends that you continue on your current medications as directed. Please refer to the Current Medication list given to you today.   Follow-Up: Your physician recommends that you schedule a follow-up appointment as needed with Dr. Berry.    

## 2018-03-28 DIAGNOSIS — L812 Freckles: Secondary | ICD-10-CM | POA: Diagnosis not present

## 2018-03-28 DIAGNOSIS — L821 Other seborrheic keratosis: Secondary | ICD-10-CM | POA: Diagnosis not present

## 2018-03-28 DIAGNOSIS — D1801 Hemangioma of skin and subcutaneous tissue: Secondary | ICD-10-CM | POA: Diagnosis not present

## 2018-03-28 DIAGNOSIS — Z85828 Personal history of other malignant neoplasm of skin: Secondary | ICD-10-CM | POA: Diagnosis not present

## 2018-03-28 DIAGNOSIS — L570 Actinic keratosis: Secondary | ICD-10-CM | POA: Diagnosis not present

## 2018-04-14 DIAGNOSIS — E785 Hyperlipidemia, unspecified: Secondary | ICD-10-CM | POA: Diagnosis not present

## 2018-04-14 DIAGNOSIS — Z79899 Other long term (current) drug therapy: Secondary | ICD-10-CM | POA: Diagnosis not present

## 2018-04-14 DIAGNOSIS — Z0000 Encounter for general adult medical examination without abnormal findings: Secondary | ICD-10-CM | POA: Diagnosis not present

## 2018-04-14 DIAGNOSIS — Z125 Encounter for screening for malignant neoplasm of prostate: Secondary | ICD-10-CM | POA: Diagnosis not present

## 2018-04-14 DIAGNOSIS — I10 Essential (primary) hypertension: Secondary | ICD-10-CM | POA: Diagnosis not present

## 2018-04-14 DIAGNOSIS — C61 Malignant neoplasm of prostate: Secondary | ICD-10-CM | POA: Diagnosis not present

## 2018-04-14 DIAGNOSIS — I714 Abdominal aortic aneurysm, without rupture: Secondary | ICD-10-CM | POA: Diagnosis not present

## 2018-05-24 DIAGNOSIS — Z8042 Family history of malignant neoplasm of prostate: Secondary | ICD-10-CM | POA: Diagnosis not present

## 2018-05-24 DIAGNOSIS — R339 Retention of urine, unspecified: Secondary | ICD-10-CM | POA: Diagnosis not present

## 2018-05-24 DIAGNOSIS — C61 Malignant neoplasm of prostate: Secondary | ICD-10-CM | POA: Diagnosis not present

## 2018-05-24 DIAGNOSIS — R972 Elevated prostate specific antigen [PSA]: Secondary | ICD-10-CM | POA: Diagnosis not present

## 2018-06-06 DIAGNOSIS — C61 Malignant neoplasm of prostate: Secondary | ICD-10-CM | POA: Diagnosis not present

## 2018-06-13 DIAGNOSIS — C61 Malignant neoplasm of prostate: Secondary | ICD-10-CM | POA: Diagnosis not present

## 2018-06-13 DIAGNOSIS — N400 Enlarged prostate without lower urinary tract symptoms: Secondary | ICD-10-CM | POA: Diagnosis not present

## 2018-06-13 DIAGNOSIS — R339 Retention of urine, unspecified: Secondary | ICD-10-CM | POA: Diagnosis not present

## 2018-06-13 DIAGNOSIS — I714 Abdominal aortic aneurysm, without rupture: Secondary | ICD-10-CM | POA: Diagnosis not present

## 2018-06-21 DIAGNOSIS — H2513 Age-related nuclear cataract, bilateral: Secondary | ICD-10-CM | POA: Diagnosis not present

## 2018-06-21 DIAGNOSIS — H401132 Primary open-angle glaucoma, bilateral, moderate stage: Secondary | ICD-10-CM | POA: Diagnosis not present

## 2018-07-07 DIAGNOSIS — C61 Malignant neoplasm of prostate: Secondary | ICD-10-CM | POA: Diagnosis not present

## 2018-07-07 DIAGNOSIS — Z79899 Other long term (current) drug therapy: Secondary | ICD-10-CM | POA: Diagnosis not present

## 2018-07-07 DIAGNOSIS — Z87891 Personal history of nicotine dependence: Secondary | ICD-10-CM | POA: Diagnosis not present

## 2018-07-07 DIAGNOSIS — Z8042 Family history of malignant neoplasm of prostate: Secondary | ICD-10-CM | POA: Diagnosis not present

## 2018-08-01 DIAGNOSIS — H25811 Combined forms of age-related cataract, right eye: Secondary | ICD-10-CM | POA: Diagnosis not present

## 2018-08-01 DIAGNOSIS — H401121 Primary open-angle glaucoma, left eye, mild stage: Secondary | ICD-10-CM | POA: Diagnosis not present

## 2018-08-01 DIAGNOSIS — H401112 Primary open-angle glaucoma, right eye, moderate stage: Secondary | ICD-10-CM | POA: Diagnosis not present

## 2018-08-15 DIAGNOSIS — Z8042 Family history of malignant neoplasm of prostate: Secondary | ICD-10-CM | POA: Diagnosis not present

## 2018-08-15 DIAGNOSIS — C61 Malignant neoplasm of prostate: Secondary | ICD-10-CM | POA: Diagnosis not present

## 2018-08-22 DIAGNOSIS — H25811 Combined forms of age-related cataract, right eye: Secondary | ICD-10-CM | POA: Diagnosis not present

## 2018-08-22 DIAGNOSIS — H401112 Primary open-angle glaucoma, right eye, moderate stage: Secondary | ICD-10-CM | POA: Diagnosis not present

## 2018-08-22 DIAGNOSIS — Z87891 Personal history of nicotine dependence: Secondary | ICD-10-CM | POA: Diagnosis not present

## 2018-08-22 DIAGNOSIS — I10 Essential (primary) hypertension: Secondary | ICD-10-CM | POA: Diagnosis not present

## 2018-08-22 DIAGNOSIS — H401110 Primary open-angle glaucoma, right eye, stage unspecified: Secondary | ICD-10-CM | POA: Diagnosis not present

## 2018-08-22 DIAGNOSIS — E7800 Pure hypercholesterolemia, unspecified: Secondary | ICD-10-CM | POA: Diagnosis not present

## 2018-08-22 DIAGNOSIS — H04123 Dry eye syndrome of bilateral lacrimal glands: Secondary | ICD-10-CM | POA: Diagnosis not present

## 2018-08-22 DIAGNOSIS — Z79899 Other long term (current) drug therapy: Secondary | ICD-10-CM | POA: Diagnosis not present

## 2018-08-22 DIAGNOSIS — Z7982 Long term (current) use of aspirin: Secondary | ICD-10-CM | POA: Diagnosis not present

## 2018-08-22 DIAGNOSIS — H52223 Regular astigmatism, bilateral: Secondary | ICD-10-CM | POA: Diagnosis not present

## 2018-09-19 DIAGNOSIS — E7800 Pure hypercholesterolemia, unspecified: Secondary | ICD-10-CM | POA: Diagnosis not present

## 2018-09-19 DIAGNOSIS — H25812 Combined forms of age-related cataract, left eye: Secondary | ICD-10-CM | POA: Diagnosis not present

## 2018-09-19 DIAGNOSIS — Z79899 Other long term (current) drug therapy: Secondary | ICD-10-CM | POA: Diagnosis not present

## 2018-09-19 DIAGNOSIS — H52223 Regular astigmatism, bilateral: Secondary | ICD-10-CM | POA: Diagnosis not present

## 2018-09-19 DIAGNOSIS — H401121 Primary open-angle glaucoma, left eye, mild stage: Secondary | ICD-10-CM | POA: Diagnosis not present

## 2018-09-19 DIAGNOSIS — I10 Essential (primary) hypertension: Secondary | ICD-10-CM | POA: Diagnosis not present

## 2018-09-19 DIAGNOSIS — Z7982 Long term (current) use of aspirin: Secondary | ICD-10-CM | POA: Diagnosis not present

## 2018-09-19 DIAGNOSIS — H401110 Primary open-angle glaucoma, right eye, stage unspecified: Secondary | ICD-10-CM | POA: Diagnosis not present

## 2018-09-19 DIAGNOSIS — H2512 Age-related nuclear cataract, left eye: Secondary | ICD-10-CM | POA: Diagnosis not present

## 2018-10-03 DIAGNOSIS — D1801 Hemangioma of skin and subcutaneous tissue: Secondary | ICD-10-CM | POA: Diagnosis not present

## 2018-10-03 DIAGNOSIS — L812 Freckles: Secondary | ICD-10-CM | POA: Diagnosis not present

## 2018-10-03 DIAGNOSIS — L821 Other seborrheic keratosis: Secondary | ICD-10-CM | POA: Diagnosis not present

## 2018-10-03 DIAGNOSIS — D692 Other nonthrombocytopenic purpura: Secondary | ICD-10-CM | POA: Diagnosis not present

## 2018-10-03 DIAGNOSIS — Z85828 Personal history of other malignant neoplasm of skin: Secondary | ICD-10-CM | POA: Diagnosis not present

## 2018-10-03 DIAGNOSIS — L570 Actinic keratosis: Secondary | ICD-10-CM | POA: Diagnosis not present

## 2018-11-16 DIAGNOSIS — H401132 Primary open-angle glaucoma, bilateral, moderate stage: Secondary | ICD-10-CM | POA: Diagnosis not present

## 2018-12-01 DIAGNOSIS — Z23 Encounter for immunization: Secondary | ICD-10-CM | POA: Diagnosis not present

## 2018-12-27 DIAGNOSIS — C61 Malignant neoplasm of prostate: Secondary | ICD-10-CM | POA: Diagnosis not present

## 2019-01-03 DIAGNOSIS — R319 Hematuria, unspecified: Secondary | ICD-10-CM | POA: Diagnosis not present

## 2019-01-03 DIAGNOSIS — C61 Malignant neoplasm of prostate: Secondary | ICD-10-CM | POA: Diagnosis not present

## 2019-01-25 ENCOUNTER — Telehealth: Payer: Self-pay | Admitting: Vascular Surgery

## 2019-01-25 NOTE — Telephone Encounter (Signed)
sch appt lvm mld ltr 02/20/2019 8am EVAR 68am f/u MD

## 2019-01-25 NOTE — Telephone Encounter (Signed)
-----   Message from Penni Homans, RN sent at 01/24/2019 11:37 AM EST ----- Regarding: Appointment Elwin Mocha,  This patient was a patient of Dr. Bridgett Larsson and needs a f/u appt with Carlis Abbott wtih an endograft duplex and office visit as soon as you can get him in.   Thanks!

## 2019-02-15 ENCOUNTER — Other Ambulatory Visit: Payer: Self-pay

## 2019-02-15 DIAGNOSIS — I714 Abdominal aortic aneurysm, without rupture, unspecified: Secondary | ICD-10-CM

## 2019-02-15 DIAGNOSIS — H401132 Primary open-angle glaucoma, bilateral, moderate stage: Secondary | ICD-10-CM | POA: Diagnosis not present

## 2019-02-15 DIAGNOSIS — Z87891 Personal history of nicotine dependence: Secondary | ICD-10-CM

## 2019-02-20 ENCOUNTER — Ambulatory Visit (INDEPENDENT_AMBULATORY_CARE_PROVIDER_SITE_OTHER): Payer: Medicare Other | Admitting: Vascular Surgery

## 2019-02-20 ENCOUNTER — Encounter: Payer: Self-pay | Admitting: Vascular Surgery

## 2019-02-20 ENCOUNTER — Ambulatory Visit (HOSPITAL_COMMUNITY)
Admission: RE | Admit: 2019-02-20 | Discharge: 2019-02-20 | Disposition: A | Discharge status: Home / Self Care | Payer: Medicare Other | Source: Ambulatory Visit | Attending: Vascular Surgery | Admitting: Vascular Surgery

## 2019-02-20 ENCOUNTER — Other Ambulatory Visit: Payer: Self-pay

## 2019-02-20 VITALS — BP 158/74 | HR 58 | Temp 98.5°F | Resp 18 | Ht 68.0 in | Wt 138.3 lb

## 2019-02-20 DIAGNOSIS — I714 Abdominal aortic aneurysm, without rupture, unspecified: Secondary | ICD-10-CM

## 2019-02-20 DIAGNOSIS — Z87891 Personal history of nicotine dependence: Secondary | ICD-10-CM

## 2019-02-20 NOTE — Progress Notes (Signed)
Patient name: Steven Mccormick MRN: [id number] DOB: 07-01-1935 Sex: male  REASON FOR VISIT: EVAR surveillance  HPI: Steven Mccormick is a 84 y.o. male with history of abdominal aortic aneurysm that underwent an EVAR on 07/14/2017 by Dr. Bridgett Larsson.  He last had a CTA 1 month postop 08/19/2017 with a small type II endoleak and at that time the aneurysm was measured about 5.4 cm.  He reports no new issues on follow-up today.  No abdominal or back pain.  His feet and legs feel fine.  Past Medical History:  Diagnosis Date  . AAA (abdominal aortic aneurysm) ([place])   . Abdominal aneurysm ([place])   . ED (erectile dysfunction)   . GERD (gastroesophageal reflux disease)   . Hyperlipidemia   . Hypertension   . Palpitations     Past Surgical History:  Procedure Laterality Date  . ABDOMINAL AORTIC ENDOVASCULAR STENT GRAFT N/A 07/14/2017   Procedure: ABDOMINAL AORTIC ENDOVASCULAR STENT GRAFT;  Surgeon: Conrad [place], MD;  Location: [facility];  Service: Vascular;  Laterality: N/A;  . HEMORROIDECTOMY    . INGUINAL HERNIA REPAIR     Right inguinal hernia  . INGUINAL HERNIA REPAIR Left Oct. 17, 2015  . KIDNEY DONATION  1969  . NEPHRECTOMY  1969   Donor for his brother    Family History  Problem Relation Age of Onset  . Cancer Brother        prostate cancer  . Anuerysm Father        abdominal aortic  . Varicose Veins Mother   . Cancer Sister        Lung  . Heart attack Sister        around 108    SOCIAL HISTORY: Social History   Tobacco Use  . Smoking status: Former Smoker    Last attempt to quit: 02/17/1992    Years since quitting: 27.0  . Smokeless tobacco: Never Used  Substance Use Topics  . Alcohol use: Yes    Alcohol/week: 0.0 standard drinks    Comment: Occasional use    No Known Allergies  Current Outpatient Medications  Medication Sig Dispense Refill  . Acetaminophen (ACETAMIN PO) Take by mouth as needed.    Marland Kitchen amLODipine (NORVASC) 10 MG tablet Take 10 mg by mouth daily.    Marland Kitchen aspirin 81  MG tablet Take 81 mg by mouth daily.    . bisoprolol-hydrochlorothiazide (ZIAC) 5-6.25 MG per tablet Take 1 tablet by mouth daily.    . Buchu-Cornsilk-Ch Grass-Hydran (HYDRO-TABS PO) Take by mouth daily.    . Cyanocobalamin (B-12) 5000 MCG CAPS Take 5,000 mcg by mouth daily.    Marland Kitchen latanoprost (XALATAN) 0.005 % ophthalmic solution Place 1 drop into both eyes at bedtime.    Marland Kitchen lisinopril (PRINIVIL,ZESTRIL) 10 MG tablet Take 10 mg by mouth daily.     . simvastatin (ZOCOR) 20 MG tablet Take 20 mg by mouth daily.     . tamsulosin (FLOMAX) 0.4 MG CAPS capsule Take 0.4 mg by mouth at bedtime.    . vitamin E 400 UNIT capsule Take 400 Units by mouth daily.    . ciprofloxacin (CIPRO) 500 MG tablet     . oxyCODONE-acetaminophen (PERCOCET/ROXICET) 5-325 MG tablet Take 1 tablet by mouth every 6 (six) hours as needed. (Patient not taking: Reported on 02/20/2019) 6 tablet 0  . ranitidine (ZANTAC) 150 MG tablet Take 150 mg by mouth daily as needed for heartburn.      No current facility-administered medications for this  visit.     REVIEW OF SYSTEMS:  [X]  denotes positive finding, [ ]  denotes negative finding Cardiac  Comments:  Chest pain or chest pressure:    Shortness of breath upon exertion:    Short of breath when lying flat:    Irregular heart rhythm:        Vascular    Pain in calf, thigh, or hip brought on by ambulation:    Pain in feet at night that wakes you up from your sleep:     Blood clot in your veins:    Leg swelling:         Pulmonary    Oxygen at home:    Productive cough:     Wheezing:         Neurologic    Sudden weakness in arms or legs:     Sudden numbness in arms or legs:     Sudden onset of difficulty speaking or slurred speech:    Temporary loss of vision in one eye:     Problems with dizziness:         Gastrointestinal    Blood in stool:     Vomited blood:         Genitourinary    Burning when urinating:     Blood in urine:        Psychiatric    Major  depression:         Hematologic    Bleeding problems:    Problems with blood clotting too easily:        Skin    Rashes or ulcers:        Constitutional    Fever or chills:      PHYSICAL EXAM: Vitals:   02/20/19 0845  BP: (!) 158/74  Pulse: (!) 58  Resp: 18  Temp: 98.5 F (36.9 C)  TempSrc: Oral  SpO2: 99%  Weight: 138 lb 5.4 oz (62.8 kg)  Height: 5\' 8"  (1.727 m)    GENERAL: The patient is a well-nourished male, in no acute distress. The vital signs are documented above. CARDIAC: There is a regular rate and rhythm.  VASCULAR:  2+ femoral pulses palpable bilaterally 2+ PT pulses palpable bilaterally PULMONARY: There is good air exchange bilaterally without wheezing or rales. ABDOMEN: Soft and non-tender with normal pitched bowel sounds.  MUSCULOSKELETAL: There are no major deformities or cyanosis. NEUROLOGIC: No focal weakness or paresthesias are detected.   DATA:   EVAR duplex today with persistent small type II endoleak and stable aneurysm sac approximately 5.4 cm.  Assessment/Plan:  Overall [name] appears to be doing well after EVAR for an infrarenal abdominal aortic aneurysm in 2018 by Dr. Bridgett Larsson.  He has previous evidence of a small type II endoleak likely from a small IMA.  On duplex surveillance today his aneurysm appears stable around 5.4 cm with no evidence of growth.  Discussed I will plan to see him back in 6 months with another aneurysm duplex just to ensure that everything remains stable and then we can likely stretch out his surveillance as he gets closer to 2 years postop.   Marty Heck, MD [facility] Office: [phone] Pager: [phone]

## 2019-04-03 DIAGNOSIS — L812 Freckles: Secondary | ICD-10-CM | POA: Diagnosis not present

## 2019-04-03 DIAGNOSIS — L218 Other seborrheic dermatitis: Secondary | ICD-10-CM | POA: Diagnosis not present

## 2019-04-03 DIAGNOSIS — C44329 Squamous cell carcinoma of skin of other parts of face: Secondary | ICD-10-CM | POA: Diagnosis not present

## 2019-04-03 DIAGNOSIS — Z85828 Personal history of other malignant neoplasm of skin: Secondary | ICD-10-CM | POA: Diagnosis not present

## 2019-04-03 DIAGNOSIS — D485 Neoplasm of uncertain behavior of skin: Secondary | ICD-10-CM | POA: Diagnosis not present

## 2019-04-03 DIAGNOSIS — L570 Actinic keratosis: Secondary | ICD-10-CM | POA: Diagnosis not present

## 2019-04-03 DIAGNOSIS — D1801 Hemangioma of skin and subcutaneous tissue: Secondary | ICD-10-CM | POA: Diagnosis not present

## 2019-04-03 DIAGNOSIS — L821 Other seborrheic keratosis: Secondary | ICD-10-CM | POA: Diagnosis not present

## 2019-04-24 DIAGNOSIS — C44329 Squamous cell carcinoma of skin of other parts of face: Secondary | ICD-10-CM | POA: Diagnosis not present

## 2019-04-24 DIAGNOSIS — Z85828 Personal history of other malignant neoplasm of skin: Secondary | ICD-10-CM | POA: Diagnosis not present

## 2019-05-08 DIAGNOSIS — I714 Abdominal aortic aneurysm, without rupture: Secondary | ICD-10-CM | POA: Diagnosis not present

## 2019-05-08 DIAGNOSIS — K219 Gastro-esophageal reflux disease without esophagitis: Secondary | ICD-10-CM | POA: Diagnosis not present

## 2019-05-08 DIAGNOSIS — E785 Hyperlipidemia, unspecified: Secondary | ICD-10-CM | POA: Diagnosis not present

## 2019-05-08 DIAGNOSIS — Z0000 Encounter for general adult medical examination without abnormal findings: Secondary | ICD-10-CM | POA: Diagnosis not present

## 2019-05-08 DIAGNOSIS — Z79899 Other long term (current) drug therapy: Secondary | ICD-10-CM | POA: Diagnosis not present

## 2019-05-08 DIAGNOSIS — R739 Hyperglycemia, unspecified: Secondary | ICD-10-CM | POA: Diagnosis not present

## 2019-05-08 DIAGNOSIS — I10 Essential (primary) hypertension: Secondary | ICD-10-CM | POA: Diagnosis not present

## 2019-07-03 DIAGNOSIS — H5213 Myopia, bilateral: Secondary | ICD-10-CM | POA: Diagnosis not present

## 2019-07-03 DIAGNOSIS — H401133 Primary open-angle glaucoma, bilateral, severe stage: Secondary | ICD-10-CM | POA: Diagnosis not present

## 2019-07-20 DIAGNOSIS — C61 Malignant neoplasm of prostate: Secondary | ICD-10-CM | POA: Diagnosis not present

## 2019-07-27 DIAGNOSIS — C61 Malignant neoplasm of prostate: Secondary | ICD-10-CM | POA: Diagnosis not present

## 2019-09-12 DIAGNOSIS — D1801 Hemangioma of skin and subcutaneous tissue: Secondary | ICD-10-CM | POA: Diagnosis not present

## 2019-09-12 DIAGNOSIS — L821 Other seborrheic keratosis: Secondary | ICD-10-CM | POA: Diagnosis not present

## 2019-09-12 DIAGNOSIS — D692 Other nonthrombocytopenic purpura: Secondary | ICD-10-CM | POA: Diagnosis not present

## 2019-09-12 DIAGNOSIS — Z85828 Personal history of other malignant neoplasm of skin: Secondary | ICD-10-CM | POA: Diagnosis not present

## 2019-09-12 DIAGNOSIS — L812 Freckles: Secondary | ICD-10-CM | POA: Diagnosis not present

## 2019-09-12 DIAGNOSIS — L570 Actinic keratosis: Secondary | ICD-10-CM | POA: Diagnosis not present

## 2019-09-21 DIAGNOSIS — Z23 Encounter for immunization: Secondary | ICD-10-CM | POA: Diagnosis not present

## 2020-01-08 DIAGNOSIS — H401133 Primary open-angle glaucoma, bilateral, severe stage: Secondary | ICD-10-CM | POA: Diagnosis not present

## 2020-01-08 DIAGNOSIS — H532 Diplopia: Secondary | ICD-10-CM | POA: Diagnosis not present

## 2020-01-08 DIAGNOSIS — Z961 Presence of intraocular lens: Secondary | ICD-10-CM | POA: Diagnosis not present

## 2020-01-08 DIAGNOSIS — H524 Presbyopia: Secondary | ICD-10-CM | POA: Diagnosis not present

## 2020-01-29 DIAGNOSIS — C61 Malignant neoplasm of prostate: Secondary | ICD-10-CM | POA: Diagnosis not present

## 2020-02-05 DIAGNOSIS — C61 Malignant neoplasm of prostate: Secondary | ICD-10-CM | POA: Diagnosis not present

## 2020-02-05 DIAGNOSIS — R3129 Other microscopic hematuria: Secondary | ICD-10-CM | POA: Diagnosis not present

## 2020-02-05 DIAGNOSIS — B349 Viral infection, unspecified: Secondary | ICD-10-CM | POA: Diagnosis not present

## 2020-02-27 DIAGNOSIS — C61 Malignant neoplasm of prostate: Secondary | ICD-10-CM | POA: Diagnosis not present

## 2020-03-04 DIAGNOSIS — Z85828 Personal history of other malignant neoplasm of skin: Secondary | ICD-10-CM | POA: Diagnosis not present

## 2020-03-04 DIAGNOSIS — L570 Actinic keratosis: Secondary | ICD-10-CM | POA: Diagnosis not present

## 2020-03-04 DIAGNOSIS — L821 Other seborrheic keratosis: Secondary | ICD-10-CM | POA: Diagnosis not present

## 2020-03-04 DIAGNOSIS — D692 Other nonthrombocytopenic purpura: Secondary | ICD-10-CM | POA: Diagnosis not present

## 2020-05-13 DIAGNOSIS — J309 Allergic rhinitis, unspecified: Secondary | ICD-10-CM | POA: Diagnosis not present

## 2020-07-07 DIAGNOSIS — H524 Presbyopia: Secondary | ICD-10-CM | POA: Diagnosis not present

## 2020-08-21 DIAGNOSIS — Z23 Encounter for immunization: Secondary | ICD-10-CM | POA: Diagnosis not present

## 2020-08-21 DIAGNOSIS — I714 Abdominal aortic aneurysm, without rupture: Secondary | ICD-10-CM | POA: Diagnosis not present

## 2020-08-21 DIAGNOSIS — E785 Hyperlipidemia, unspecified: Secondary | ICD-10-CM | POA: Diagnosis not present

## 2020-08-21 DIAGNOSIS — I10 Essential (primary) hypertension: Secondary | ICD-10-CM | POA: Diagnosis not present

## 2020-08-21 DIAGNOSIS — K219 Gastro-esophageal reflux disease without esophagitis: Secondary | ICD-10-CM | POA: Diagnosis not present

## 2020-08-25 DIAGNOSIS — C61 Malignant neoplasm of prostate: Secondary | ICD-10-CM | POA: Diagnosis not present

## 2020-09-01 DIAGNOSIS — C61 Malignant neoplasm of prostate: Secondary | ICD-10-CM | POA: Diagnosis not present

## 2020-09-08 DIAGNOSIS — D692 Other nonthrombocytopenic purpura: Secondary | ICD-10-CM | POA: Diagnosis not present

## 2020-09-08 DIAGNOSIS — Z85828 Personal history of other malignant neoplasm of skin: Secondary | ICD-10-CM | POA: Diagnosis not present

## 2020-09-08 DIAGNOSIS — L570 Actinic keratosis: Secondary | ICD-10-CM | POA: Diagnosis not present

## 2020-09-08 DIAGNOSIS — L821 Other seborrheic keratosis: Secondary | ICD-10-CM | POA: Diagnosis not present

## 2021-01-07 DIAGNOSIS — H532 Diplopia: Secondary | ICD-10-CM | POA: Diagnosis not present

## 2021-01-07 DIAGNOSIS — H401133 Primary open-angle glaucoma, bilateral, severe stage: Secondary | ICD-10-CM | POA: Diagnosis not present

## 2021-01-07 DIAGNOSIS — Z961 Presence of intraocular lens: Secondary | ICD-10-CM | POA: Diagnosis not present

## 2021-01-07 DIAGNOSIS — H16103 Unspecified superficial keratitis, bilateral: Secondary | ICD-10-CM | POA: Diagnosis not present

## 2021-04-27 DIAGNOSIS — C61 Malignant neoplasm of prostate: Secondary | ICD-10-CM | POA: Diagnosis not present

## 2021-05-04 DIAGNOSIS — C61 Malignant neoplasm of prostate: Secondary | ICD-10-CM | POA: Diagnosis not present

## 2021-06-30 DIAGNOSIS — Z23 Encounter for immunization: Secondary | ICD-10-CM | POA: Diagnosis not present

## 2021-11-04 DIAGNOSIS — N390 Urinary tract infection, site not specified: Secondary | ICD-10-CM | POA: Diagnosis not present

## 2021-11-04 DIAGNOSIS — N3001 Acute cystitis with hematuria: Secondary | ICD-10-CM | POA: Diagnosis not present

## 2021-11-20 DIAGNOSIS — I10 Essential (primary) hypertension: Secondary | ICD-10-CM | POA: Diagnosis not present

## 2021-11-20 DIAGNOSIS — E785 Hyperlipidemia, unspecified: Secondary | ICD-10-CM | POA: Diagnosis not present

## 2021-11-20 DIAGNOSIS — K2960 Other gastritis without bleeding: Secondary | ICD-10-CM | POA: Diagnosis not present

## 2021-11-20 DIAGNOSIS — C61 Malignant neoplasm of prostate: Secondary | ICD-10-CM | POA: Diagnosis not present

## 2021-11-24 DIAGNOSIS — Z23 Encounter for immunization: Secondary | ICD-10-CM | POA: Diagnosis not present

## 2021-11-25 DIAGNOSIS — M25522 Pain in left elbow: Secondary | ICD-10-CM | POA: Diagnosis not present

## 2021-11-25 DIAGNOSIS — M7712 Lateral epicondylitis, left elbow: Secondary | ICD-10-CM | POA: Diagnosis not present

## 2021-11-25 DIAGNOSIS — M25422 Effusion, left elbow: Secondary | ICD-10-CM | POA: Diagnosis not present

## 2021-11-25 DIAGNOSIS — Z681 Body mass index (BMI) 19 or less, adult: Secondary | ICD-10-CM | POA: Diagnosis not present

## 2021-11-27 DIAGNOSIS — M25522 Pain in left elbow: Secondary | ICD-10-CM | POA: Diagnosis not present

## 2021-11-30 NOTE — Progress Notes (Addendum)
Assessment/Plan:   Steven Mccormick is a very pleasant 86 y.o. year old RH male with risk factors including  age, prostate cancer, hypertension, hyperlipidemia, prior AAA s/p repair,  seen today for evaluation of memory loss. MoCA today 15/30 with deficiencies in visuospatial executive, memory, delayed recall 0/5, abstraction, and fluency.  Patient currently is on memantine 5 mg nightly, increasing to 5 mg twice daily soon by PCP.  He is also on Remeron 50 mg nightly for sleep, depression and poor appetite.  Recent available labs are unremarkable.    Recommendations:   Late onset dementia likely due to Alzheimer's disease  MRI brain with/without contrast to assess for underlying structural abnormality and assess vascular load  Check B12  Discussed safety both in and out of the home.  Discussed the importance of regular daily schedule with inclusion of crossword puzzles to maintain brain function.  Continue to monitor mood .Continue Remeron 50 mg nightly by PCP Stay active at least 30 minutes at least 3 times a week.  Naps should be scheduled and should be no longer than 60 minutes and should not occur after 2 PM.  Mediterranean diet is recommended  Continue memantine 5 mg nightly for another week, then can increase to memantine 5 mg twice daily.  Side effects discussed Follow-up in 3 months   Subjective:    The patient is seen in neurologic consultation at the request of Street, [name], * for the evaluation of memory.  The patient is accompanied by  who supplements the history. This is a 86 y.o. year old RH  male who during the well visit, his daughter had mentioned that he was showing some cognitive difficulties.  Apparently, the patient had been going through a lot of stress in caring for his wife, and per daughter report, has shown decline especially over the last few months.  Patient is unsure how long he had memory difficulties, although his daughters says that he was sharp  until then.  At the PCPs office, he was placed on mirtazapine to help with sleep and mood, and also to help with his appetite which has been decreased over the last few months.  For memory, he was placed on memantine 5 mg nightly, with plans of increasing it to twice daily in the next week. His daughter states that him and his wife do not sleep in the same room, but after her hospitalization for low sodium, when she returned she slept in the same bed.  It was an episode where he woke up, and did not know who the person next to him was.  He explains that it is because he is used to sleeping by himself, but his daughter is more concerned about him being disoriented.  In addition, there are times in which he reports that his house is not his, he does not recognize it, even though he has been living there for 65 years.  He does not do crossword puzzles or word finding often. He denies any hallucinations or paranoia.  He denies leaving objects in unusual places, repeating the same stories or asking the same questions.  He does not have a history of depression, but had situational depression while taking care of his wife.  Denies irritability. There are no hygiene concerns, he is independent of dressing and bathing.  His daughter helps set the pillbox for medications, and also helps with his finances.  His appetite, as mentioned above, has been decreased, denies trouble swallowing.  He  cooks and denies any issues leaving the stove or the faucet on.  He ambulates without difficulty, denies any falls or head injuries.  HE does not ambulate often. He denies any headaches, double vision, dizziness, focal numbness or tingling, unilateral weakness, tremors or anosmia.  No history of seizures.  Denies urine incontinence, retention, constipation or diarrhea.  Denies a history of OSA, alcohol, or tobacco.  Family history remarkable for 1 brother with dementia, and Parkinson's disease, and 2 brothers with dementia.   CBC and  CMP normal TSH 1.472, free T4 0.84, normal A1c is 6 No Known Allergies  Current Outpatient Medications  Medication Instructions   Acetaminophen (ACETAMIN PO) Oral, As needed   amLODipine (NORVASC) 10 mg, Oral, Daily   aspirin 81 mg, Oral, Daily   B-12 5,000 mcg, Oral, Daily   bisoprolol-hydrochlorothiazide (ZIAC) 5-6.25 MG per tablet 1 tablet, Oral, Daily   Buchu-Cornsilk-Ch Grass-Hydran (HYDRO-TABS PO) Oral, Daily   ciprofloxacin (CIPRO) 500 MG tablet No dose, route, or frequency recorded.   latanoprost (XALATAN) 0.005 % ophthalmic solution 1 drop, Both Eyes, Daily at bedtime   lisinopril (ZESTRIL) 10 mg, Oral, Daily   memantine (NAMENDA) 5 mg, Oral, 2 times daily   mirtazapine (REMERON) 15 mg, Oral, Daily at bedtime   oxyCODONE-acetaminophen (PERCOCET/ROXICET) 5-325 MG tablet 1 tablet, Oral, Every 6 hours PRN   ranitidine (ZANTAC) 150 mg, Daily PRN   simvastatin (ZOCOR) 20 mg, Oral, Daily   tamsulosin (FLOMAX) 0.4 mg, Oral, Daily at bedtime   triamcinolone cream (KENALOG) 0.1 % APPLY CREAM TO AFFECTED AREA(S) ON LEGS TWICE DAILY   vitamin E 400 Units, Oral, Daily     VITALS:   Vitals:   12/01/21 1006  BP: 138/76  Pulse: 96  Resp: 20  SpO2: 98%  Weight: 122 lb (55.3 kg)  Height: 5\' 8"  (1.727 m)   Depression screen [facility] 2/9 04/23/2016  Decreased Interest 0  Down, Depressed, Hopeless 0  PHQ - 2 Score 0    PHYSICAL EXAM   HEENT:  Normocephalic, atraumatic. The mucous membranes are moist. The superficial temporal arteries are without ropiness or tenderness. Cardiovascular: Regular rate and rhythm. Lungs: Clear to auscultation bilaterally. Neck: There are no carotid bruits noted bilaterally.  NEUROLOGICAL: Montreal Cognitive Assessment  12/01/2021  Visuospatial/ Executive (0/5) 0  Naming (0/3) 3  Attention: Read list of digits (0/2) 2  Attention: Read list of letters (0/1) 1  Attention: Serial 7 subtraction starting at 100 (0/3) 3  Language: Repeat phrase (0/2) 1   Language : Fluency (0/1) 0  Abstraction (0/2) 0  Delayed Recall (0/5) 0  Orientation (0/6) 5  Total 15  Adjusted Score (based on education) 15   No flowsheet data found.  No flowsheet data found.   Orientation:  Alert and oriented to person, place and time. No aphasia or dysarthria. Fund of knowledge is appropriate. Recent memory impaired and remote memory intact.  Attention and concentration are normal.  Able to name objects and repeat phrases. Delayed recall 0 /5. Unable to draw  Cranial nerves: There is good facial symmetry. Extraocular muscles are intact and visual fields are full to confrontational testing. Speech is fluent and clear. Soft palate rises symmetrically and there is no tongue deviation. Hearing is intact to conversational tone. Tone: Tone is good throughout. Sensation: Sensation is intact to light touch and pinprick throughout. Vibration is intact at the bilateral big toe.There is no extinction with double simultaneous stimulation. There is no sensory dermatomal level identified. Coordination: The patient  has no difficulty with RAM's or FNF bilaterally. Normal finger to nose  Motor: Strength is 5/5 in the bilateral upper and lower extremities. There is no pronator drift. There are no fasciculations noted. DTR's: Deep tendon reflexes are 2/4 at the bilateral biceps, triceps, brachioradialis, patella and achilles.  Plantar responses are downgoing bilaterally. Gait and Station: The patient is able to ambulate without difficulty.The patient is able to heel toe walk without any difficulty.The patient is able to ambulate in a tandem fashion. The patient is able to stand in the Romberg position.     Thank you for allowing [place] the opportunity to participate in the care of this nice patient. Please do not hesitate to contact us for any questions or concerns.   Total time spent on today's visit was 60 minutes, including both face-to-face time and nonface-to-face time.  Time included  that spent on review of records (prior notes available to me/labs/imaging if pertinent), discussing treatment and goals, answering patient's questions and coordinating care.  Cc:  Street, [name], MD  Sharene Butters 12/01/2021 12:54 PM

## 2021-12-01 ENCOUNTER — Encounter: Payer: Self-pay | Admitting: Physician Assistant

## 2021-12-01 ENCOUNTER — Ambulatory Visit: Payer: Medicare Other | Admitting: Physician Assistant

## 2021-12-01 ENCOUNTER — Other Ambulatory Visit (INDEPENDENT_AMBULATORY_CARE_PROVIDER_SITE_OTHER): Payer: Medicare Other

## 2021-12-01 ENCOUNTER — Other Ambulatory Visit: Payer: Self-pay

## 2021-12-01 VITALS — BP 138/76 | HR 96 | Resp 20 | Ht 68.0 in | Wt 122.0 lb

## 2021-12-01 DIAGNOSIS — R413 Other amnesia: Secondary | ICD-10-CM | POA: Diagnosis not present

## 2021-12-01 DIAGNOSIS — F0280 Dementia in other diseases classified elsewhere without behavioral disturbance: Secondary | ICD-10-CM

## 2021-12-01 DIAGNOSIS — G309 Alzheimer's disease, unspecified: Secondary | ICD-10-CM

## 2021-12-01 LAB — VITAMIN B12: Vitamin B-12: 389 pg/mL (ref 211–911)

## 2021-12-01 NOTE — Patient Instructions (Addendum)
It was a pleasure to see you today at our office.   Recommendations:  Follow up in 3  months Continue Memantine 5 mg twice daily.Side effects were discussed  CHeck B12    RECOMMENDATIONS FOR ALL PATIENTS WITH MEMORY PROBLEMS: 1. Continue to exercise (Recommend 30 minutes of walking everyday, or 3 hours every week) 2. Increase social interactions - continue going to [place] and enjoy social gatherings with friends and family 3. Eat healthy, avoid fried foods and eat more fruits and vegetables 4. Maintain adequate blood pressure, blood sugar, and blood cholesterol level. Reducing the risk of stroke and cardiovascular disease also helps promoting better memory. 5. Avoid stressful situations. Live a simple life and avoid aggravations. Organize your time and prepare for the next day in anticipation. 6. Sleep well, avoid any interruptions of sleep and avoid any distractions in the bedroom that may interfere with adequate sleep quality 7. Avoid sugar, avoid sweets as there is a strong link between excessive sugar intake, diabetes, and cognitive impairment We discussed the Mediterranean diet, which has been shown to help patients reduce the risk of progressive memory disorders and reduces cardiovascular risk. This includes eating fish, eat fruits and green leafy vegetables, nuts like almonds and hazelnuts, walnuts, and also use olive oil. Avoid fast foods and fried foods as much as possible. Avoid sweets and sugar as sugar use has been linked to worsening of memory function.  There is always a concern of gradual progression of memory problems. If this is the case, then we may need to adjust level of care according to patient needs. Support, both to the patient and caregiver, should then be put into place.    FALL PRECAUTIONS: Be cautious when walking. Scan the area for obstacles that may increase the risk of trips and falls. When getting up in the mornings, sit up at the edge of the bed for a few  minutes before getting out of bed. Consider elevating the bed at the head end to avoid drop of blood pressure when getting up. Walk always in a well-lit room (use night lights in the walls). Avoid area rugs or power cords from appliances in the middle of the walkways. Use a walker or a cane if necessary and consider physical therapy for balance exercise. Get your eyesight checked regularly.  FINANCIAL OVERSIGHT: Supervision, especially oversight when making financial decisions or transactions is also recommended.  HOME SAFETY: Consider the safety of the kitchen when operating appliances like stoves, microwave oven, and blender. Consider having supervision and share cooking responsibilities until no longer able to participate in those. Accidents with firearms and other hazards in the house should be identified and addressed as well.   ABILITY TO BE LEFT ALONE: If patient is unable to contact 911 operator, consider using LifeLine, or when the need is there, arrange for someone to stay with patients. Smoking is a fire hazard, consider supervision or cessation. Risk of wandering should be assessed by caregiver and if detected at any point, supervision and safe proof recommendations should be instituted.  MEDICATION SUPERVISION: Inability to self-administer medication needs to be constantly addressed. Implement a mechanism to ensure safe administration of the medications.   DRIVING: Regarding driving, in patients with progressive memory problems, driving will be impaired. We advise to have someone else do the driving if trouble finding directions or if minor accidents are reported. Independent driving assessment is available to determine safety of driving.   If you are interested in the driving assessment, you can  contact the following:  [employer] in [place]  [place] [phone]  [facility] [phone]  or [phone] We have sent a referral to [facility] for your MRI and they will call you directly to schedule your appointment. They are located at [place]. If you need to contact them directly please call [phone].  Your provider has requested that you have labwork completed today. Please go to [facility] ([id number]) on the second floor of this building before leaving the office today. You do not need to check in. If you are not called within 15 minutes please check with the front desk.

## 2021-12-03 DIAGNOSIS — L821 Other seborrheic keratosis: Secondary | ICD-10-CM | POA: Diagnosis not present

## 2021-12-03 DIAGNOSIS — L570 Actinic keratosis: Secondary | ICD-10-CM | POA: Diagnosis not present

## 2021-12-03 DIAGNOSIS — Z85828 Personal history of other malignant neoplasm of skin: Secondary | ICD-10-CM | POA: Diagnosis not present

## 2021-12-03 DIAGNOSIS — D225 Melanocytic nevi of trunk: Secondary | ICD-10-CM | POA: Diagnosis not present

## 2021-12-09 DIAGNOSIS — C61 Malignant neoplasm of prostate: Secondary | ICD-10-CM | POA: Diagnosis not present

## 2021-12-22 ENCOUNTER — Other Ambulatory Visit: Payer: Medicare Other

## 2021-12-29 DIAGNOSIS — M79672 Pain in left foot: Secondary | ICD-10-CM | POA: Diagnosis not present

## 2021-12-29 DIAGNOSIS — Z6820 Body mass index (BMI) 20.0-20.9, adult: Secondary | ICD-10-CM | POA: Diagnosis not present

## 2021-12-29 DIAGNOSIS — R600 Localized edema: Secondary | ICD-10-CM | POA: Diagnosis not present

## 2022-01-05 DIAGNOSIS — C61 Malignant neoplasm of prostate: Secondary | ICD-10-CM | POA: Diagnosis not present

## 2022-01-28 DIAGNOSIS — R4181 Age-related cognitive decline: Secondary | ICD-10-CM | POA: Diagnosis not present

## 2022-01-28 DIAGNOSIS — C61 Malignant neoplasm of prostate: Secondary | ICD-10-CM | POA: Diagnosis not present

## 2022-01-28 DIAGNOSIS — E441 Mild protein-calorie malnutrition: Secondary | ICD-10-CM | POA: Diagnosis not present

## 2022-01-28 DIAGNOSIS — R600 Localized edema: Secondary | ICD-10-CM | POA: Diagnosis not present

## 2022-02-24 ENCOUNTER — Ambulatory Visit: Payer: Medicare Other | Admitting: Physician Assistant

## 2022-02-24 ENCOUNTER — Other Ambulatory Visit: Payer: Self-pay

## 2022-02-24 ENCOUNTER — Encounter: Payer: Self-pay | Admitting: Physician Assistant

## 2022-02-24 VITALS — BP 143/64 | HR 58 | Ht 68.0 in | Wt 134.0 lb

## 2022-02-24 DIAGNOSIS — F0280 Dementia in other diseases classified elsewhere without behavioral disturbance: Secondary | ICD-10-CM | POA: Diagnosis not present

## 2022-02-24 DIAGNOSIS — G309 Alzheimer's disease, unspecified: Secondary | ICD-10-CM | POA: Diagnosis not present

## 2022-02-24 MED ORDER — MEMANTINE HCL 5 MG PO TABS: 5.0000 mg | ORAL_TABLET | Freq: Two times a day (BID) | ORAL | 11 refills | Status: DC

## 2022-02-24 NOTE — Progress Notes (Signed)
? ?Assessment/Plan:  ? ? 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 ?He stable from the cognitive standpoint, no significant changes.  The patient is on memantine 5 mg twice daily, tolerating well. ? ? ? Recommendations:  ? ?Discussed safety both in and out of the home.  ?Discussed the importance of regular daily schedule with inclusion of crossword puzzles to maintain brain function.  ?Continue to monitor mood by PCP.  Continue Remeron 50 mg nightly ?Stay active at least 30 minutes at least 3 times a week.  ?Naps should be scheduled and should be no longer than 60 minutes and should not occur after 2 PM.  ?Mediterranean diet is recommended  ?Control cardiovascular risk factors  ?Continue memantine 5 mg twice daily ?Follow up in 6 months. ? ? ?Case discussed with Dr. Delice Lesch who agrees with the plan ? ? ? ? ?Subjective:  ? ? ?Steven Mccormick is a very pleasant 87 y.o. RH male with a history of prostate cancer, hypertension, hyperlipidemia, prior AAA status postrepair, seen today in follow up for memory loss. This patient is accompanied in the office by his daughter who supplements the history.  Previous records as well as any outside records available were reviewed prior to todays visit.  Patient was last seen at our office on 12/01/2021 at which time his MoCA was 15/30.  He is on memantine '5mg'$  twice daily, tolerating well.  He is also on Remeron 50 mg nightly for sleep, depression and poor appetite. ?Since his last visit, he feels that his memory is about the same.  Occasionally he has some confusion according to his daughter.  His short-term memory is stable.  His daughter continues to report that at times he is disoriented, believing that his house is not he is, does not recognize it at times, even though he has been living there for 65 years.  He likes to go to his farm which [place] miles away, he drives those short distances without feeling lost.  He denies any hallucinations or paranoia, but sometimes  he confuses his daughter with his wife, "because it look-alike".  He denies leaving objects in unusual places, repeating the same stories or asking the same questions.  He continues to take care of his wife who was recently in the hospital, and causes situational depression.  He sleeps okay, but the sleep gets interrupted by his wife who wakes him up.  There are no hygiene concerns, independent of bathing and dressing.  His daughter manages the medications and the finances.  His appetite is fair, denies trouble swallowing.  He cooks and denies any issues leaving the stove or the faucet on.  He ambulates without difficulty, denies any falls or head injuries.  He does not ambulate as frequently as before.  He denies any headaches, double vision, dizziness, focal numbness or tingling, unilateral weakness, tremors or anosmia, or seizures.  He denies urine incontinence, retention, constipation or diarrhea. ? ? ? ? ?Initial Visit 10/01/21 The patient is seen in neurologic consultation at the request of Street, [name], * for the evaluation of memory.  The patient is accompanied by  who supplements the history. ?This is a 87 y.o. year old RH  male who during the well visit, his daughter had mentioned that he was showing some cognitive difficulties.  Apparently, the patient had been going through a lot of stress in caring for his wife, and per daughter report, has shown decline especially over the last few months.  Patient is unsure how long he had memory difficulties, although his daughters says that he was sharp until then.  At the PCPs office, he was placed on mirtazapine to help with sleep and mood, and also to help with his appetite which has been decreased over the last few months.  For memory, he was placed on memantine 5 mg nightly, with plans of increasing it to twice daily in the next week. ?His daughter states that him and his wife do not sleep in the same room, but after her hospitalization for low sodium,  when she returned she slept in the same bed.  It was an episode where he woke up, and did not know who the person next to him was.  He explains that it is because he is used to sleeping by himself, but his daughter is more concerned about him being disoriented.  In addition, there are times in which he reports that his house is not his, he does not recognize it, even though he has been living there for 65 years.  He does not do crossword puzzles or word finding often. He denies any hallucinations or paranoia.  He denies leaving objects in unusual places, repeating the same stories or asking the same questions.  He does not have a history of depression, but had situational depression while taking care of his wife.  Denies irritability. ?There are no hygiene concerns, he is independent of dressing and bathing.  His daughter helps set the pillbox for medications, and also helps with his finances.  His appetite, as mentioned above, has been decreased, denies trouble swallowing.  He cooks and denies any issues leaving the stove or the faucet on.  He ambulates without difficulty, denies any falls or head injuries.  HE does not ambulate often. He denies any headaches, double vision, dizziness, focal numbness or tingling, unilateral weakness, tremors or anosmia.  No history of seizures.  Denies urine incontinence, retention, constipation or diarrhea.  Denies a history of OSA, alcohol, or tobacco.  Family history remarkable for 1 brother with dementia, and Parkinson's disease, and 2 brothers with dementia. ?  ?CBC and CMP normal ?TSH 1.472, free T4 0.84, normal ?A1c is 6 ?No Known Allergies ?  ? ? ? ? ?PREVIOUS MEDICATIONS:  ? ?CURRENT MEDICATIONS:  ?Outpatient Encounter Medications as of 02/24/2022  ?Medication Sig  ? Acetaminophen (ACETAMIN PO) Take by mouth as needed.  ? amLODipine (NORVASC) 10 MG tablet Take 10 mg by mouth daily.  ? aspirin 81 MG tablet Take 81 mg by mouth daily.  ? bisoprolol-hydrochlorothiazide (ZIAC)  5-6.25 MG per tablet Take 1 tablet by mouth daily.  ? Buchu-Cornsilk-Ch Grass-Hydran (HYDRO-TABS PO) Take by mouth daily.  ? Cyanocobalamin (B-12) 5000 MCG CAPS Take 5,000 mcg by mouth daily.  ? latanoprost (XALATAN) 0.005 % ophthalmic solution Place 1 drop into both eyes at bedtime.  ? lisinopril (PRINIVIL,ZESTRIL) 10 MG tablet Take 10 mg by mouth daily.   ? memantine (NAMENDA) 5 MG tablet Take 5 mg by mouth 2 (two) times daily.  ? mirtazapine (REMERON) 15 MG tablet Take 15 mg by mouth at bedtime.  ? oxyCODONE-acetaminophen (PERCOCET/ROXICET) 5-325 MG tablet Take 1 tablet by mouth every 6 (six) hours as needed.  ? ranitidine (ZANTAC) 150 MG tablet Take 150 mg by mouth daily as needed for heartburn.  ? simvastatin (ZOCOR) 20 MG tablet Take 20 mg by mouth daily.   ? tamsulosin (FLOMAX) 0.4 MG CAPS capsule Take 0.4 mg by mouth at bedtime.  ?  triamcinolone cream (KENALOG) 0.1 % APPLY CREAM TO AFFECTED AREA(S) ON LEGS TWICE DAILY  ? vitamin E 400 UNIT capsule Take 400 Units by mouth daily.  ? [DISCONTINUED] ciprofloxacin (CIPRO) 500 MG tablet  (Patient not taking: Reported on 12/01/2021)  ? ?No facility-administered encounter medications on file as of 02/24/2022.  ? ? ? ?Objective:  ?  ? ?PHYSICAL EXAMINATION:   ? ?VITALS:   ?Vitals:  ? 02/24/22 0942  ?BP: (!) 143/64  ?Pulse: (!) 58  ?SpO2: 100%  ?Weight: 134 lb (60.8 kg)  ?Height: '5\' 8"'$  (1.727 m)  ? ? ?GEN:  The patient appears stated age and is in NAD. ?HEENT:  Normocephalic, atraumatic.  ? ?Neurological examination: ? ?General: NAD, well-groomed, appears stated age. ?Orientation: The patient is alert. Oriented to person, place and date ?Cranial nerves: There is good facial symmetry.The speech is fluent and clear. No aphasia or dysarthria. Fund of knowledge is appropriate. Recent memory impaired, remote memory intact. Attention and concentration are normal.  Able to name objects and repeat phrases.  Hearing is intact to conversational tone.    ?Sensation: Sensation is  intact to light touch throughout ?Motor: Strength is at least antigravity x4. ?Tremors: none  ?DTR's 2/4 in UE/LE  ? ?  ?Montreal Cognitive Assessment  12/01/2021  ?Visuospatial/ Executive (0/5) 0  ?

## 2022-02-24 NOTE — Patient Instructions (Addendum)
It was a pleasure to see you today at our office.  ? ?Recommendations: ? ?Follow up in 6 months ?Continue Memantine 5 mg twice daily. Side effects were discussed  ? ? ? ?RECOMMENDATIONS FOR ALL PATIENTS WITH MEMORY PROBLEMS: ?1. Continue to exercise (Recommend 30 minutes of walking everyday, or 3 hours every week) ?2. Increase social interactions - continue going to [place] and enjoy social gatherings with friends and family ?3. Eat healthy, avoid fried foods and eat more fruits and vegetables ?4. Maintain adequate blood pressure, blood sugar, and blood cholesterol level. Reducing the risk of stroke and cardiovascular disease also helps promoting better memory. ?5. Avoid stressful situations. Live a simple life and avoid aggravations. Organize your time and prepare for the next day in anticipation. ?6. Sleep well, avoid any interruptions of sleep and avoid any distractions in the bedroom that may interfere with adequate sleep quality ?7. Avoid sugar, avoid sweets as there is a strong link between excessive sugar intake, diabetes, and cognitive impairment ?We discussed the Mediterranean diet, which has been shown to help patients reduce the risk of progressive memory disorders and reduces cardiovascular risk. This includes eating fish, eat fruits and green leafy vegetables, nuts like almonds and hazelnuts, walnuts, and also use olive oil. Avoid fast foods and fried foods as much as possible. Avoid sweets and sugar as sugar use has been linked to worsening of memory function. ? ?There is always a concern of gradual progression of memory problems. If this is the case, then we may need to adjust level of care according to patient needs. Support, both to the patient and caregiver, should then be put into place.  ? ? ?FALL PRECAUTIONS: Be cautious when walking. Scan the area for obstacles that may increase the risk of trips and falls. When getting up in the mornings, sit up at the edge of the bed for a few minutes before  getting out of bed. Consider elevating the bed at the head end to avoid drop of blood pressure when getting up. Walk always in a well-lit room (use night lights in the walls). Avoid area rugs or power cords from appliances in the middle of the walkways. Use a walker or a cane if necessary and consider physical therapy for balance exercise. Get your eyesight checked regularly. ? ?FINANCIAL OVERSIGHT: Supervision, especially oversight when making financial decisions or transactions is also recommended. ? ?HOME SAFETY: Consider the safety of the kitchen when operating appliances like stoves, microwave oven, and blender. Consider having supervision and share cooking responsibilities until no longer able to participate in those. Accidents with firearms and other hazards in the house should be identified and addressed as well. ? ? ?ABILITY TO BE LEFT ALONE: If patient is unable to contact 911 operator, consider using [employer], or when the need is there, arrange for someone to stay with patients. Smoking is a fire hazard, consider supervision or cessation. Risk of wandering should be assessed by caregiver and if detected at any point, supervision and safe proof recommendations should be instituted. ? ?MEDICATION SUPERVISION: Inability to self-administer medication needs to be constantly addressed. Implement a mechanism to ensure safe administration of the medications. ? ? ?DRIVING: Regarding driving, in patients with progressive memory problems, driving will be impaired. We advise to have someone else do the driving if trouble finding directions or if minor accidents are reported. Independent driving assessment is available to determine safety of driving. ? ? ?If you are interested in the driving assessment, you can contact the  following: ? ?[facility] [employer] [facility] in [place] ? ?[place] [phone] ? ?[facility] [phone] ? ?[facility] [phone] or  [phone] ?We have sent a referral to [facility] for your MRI and they will call you directly to schedule your appointment. They are located at [address]. If you need to contact them directly please call [phone].  ?Your provider has requested that you have labwork completed today. Please go to [facility] ([id number]) on the second floor of this building before leaving the office today. You do not need to check in. If you are not called within 15 minutes please check with the front desk.  ?

## 2022-04-08 DIAGNOSIS — R600 Localized edema: Secondary | ICD-10-CM | POA: Diagnosis not present

## 2022-04-08 DIAGNOSIS — F02A3 Dementia in other diseases classified elsewhere, mild, with mood disturbance: Secondary | ICD-10-CM | POA: Diagnosis not present

## 2022-04-08 DIAGNOSIS — E785 Hyperlipidemia, unspecified: Secondary | ICD-10-CM | POA: Diagnosis not present

## 2022-04-08 DIAGNOSIS — G301 Alzheimer's disease with late onset: Secondary | ICD-10-CM | POA: Diagnosis not present

## 2022-04-13 DIAGNOSIS — Z23 Encounter for immunization: Secondary | ICD-10-CM | POA: Diagnosis not present

## 2022-05-18 DIAGNOSIS — L570 Actinic keratosis: Secondary | ICD-10-CM | POA: Diagnosis not present

## 2022-05-18 DIAGNOSIS — D692 Other nonthrombocytopenic purpura: Secondary | ICD-10-CM | POA: Diagnosis not present

## 2022-05-18 DIAGNOSIS — L821 Other seborrheic keratosis: Secondary | ICD-10-CM | POA: Diagnosis not present

## 2022-05-18 DIAGNOSIS — Z85828 Personal history of other malignant neoplasm of skin: Secondary | ICD-10-CM | POA: Diagnosis not present

## 2022-08-26 DIAGNOSIS — C61 Malignant neoplasm of prostate: Secondary | ICD-10-CM | POA: Diagnosis not present

## 2022-08-26 DIAGNOSIS — Z8042 Family history of malignant neoplasm of prostate: Secondary | ICD-10-CM | POA: Diagnosis not present

## 2022-08-26 DIAGNOSIS — Z79818 Long term (current) use of other agents affecting estrogen receptors and estrogen levels: Secondary | ICD-10-CM | POA: Diagnosis not present

## 2022-08-31 ENCOUNTER — Ambulatory Visit: Payer: Medicare Other | Admitting: Physician Assistant

## 2022-09-08 DIAGNOSIS — R053 Chronic cough: Secondary | ICD-10-CM | POA: Diagnosis not present

## 2022-09-08 DIAGNOSIS — R432 Parageusia: Secondary | ICD-10-CM | POA: Diagnosis not present

## 2022-09-08 DIAGNOSIS — G301 Alzheimer's disease with late onset: Secondary | ICD-10-CM | POA: Diagnosis not present

## 2022-09-08 DIAGNOSIS — R430 Anosmia: Secondary | ICD-10-CM | POA: Diagnosis not present

## 2022-11-30 DIAGNOSIS — L570 Actinic keratosis: Secondary | ICD-10-CM | POA: Diagnosis not present

## 2022-11-30 DIAGNOSIS — Z85828 Personal history of other malignant neoplasm of skin: Secondary | ICD-10-CM | POA: Diagnosis not present

## 2022-11-30 DIAGNOSIS — D1801 Hemangioma of skin and subcutaneous tissue: Secondary | ICD-10-CM | POA: Diagnosis not present

## 2022-11-30 DIAGNOSIS — L821 Other seborrheic keratosis: Secondary | ICD-10-CM | POA: Diagnosis not present

## 2022-12-01 DIAGNOSIS — G301 Alzheimer's disease with late onset: Secondary | ICD-10-CM | POA: Diagnosis not present

## 2022-12-01 DIAGNOSIS — F02A3 Dementia in other diseases classified elsewhere, mild, with mood disturbance: Secondary | ICD-10-CM | POA: Diagnosis not present

## 2022-12-01 DIAGNOSIS — E785 Hyperlipidemia, unspecified: Secondary | ICD-10-CM | POA: Diagnosis not present

## 2022-12-01 DIAGNOSIS — I10 Essential (primary) hypertension: Secondary | ICD-10-CM | POA: Diagnosis not present

## 2022-12-16 DIAGNOSIS — K080 Exfoliation of teeth due to systemic causes: Secondary | ICD-10-CM | POA: Diagnosis not present

## 2023-01-13 DIAGNOSIS — K080 Exfoliation of teeth due to systemic causes: Secondary | ICD-10-CM | POA: Diagnosis not present

## 2023-02-08 DIAGNOSIS — K080 Exfoliation of teeth due to systemic causes: Secondary | ICD-10-CM | POA: Diagnosis not present

## 2023-02-10 DIAGNOSIS — C44222 Squamous cell carcinoma of skin of right ear and external auricular canal: Secondary | ICD-10-CM | POA: Diagnosis not present

## 2023-02-10 DIAGNOSIS — L570 Actinic keratosis: Secondary | ICD-10-CM | POA: Diagnosis not present

## 2023-02-10 DIAGNOSIS — C4442 Squamous cell carcinoma of skin of scalp and neck: Secondary | ICD-10-CM | POA: Diagnosis not present

## 2023-02-10 DIAGNOSIS — C44329 Squamous cell carcinoma of skin of other parts of face: Secondary | ICD-10-CM | POA: Diagnosis not present

## 2023-03-02 DIAGNOSIS — K080 Exfoliation of teeth due to systemic causes: Secondary | ICD-10-CM | POA: Diagnosis not present

## 2023-03-16 DIAGNOSIS — Z8042 Family history of malignant neoplasm of prostate: Secondary | ICD-10-CM | POA: Diagnosis not present

## 2023-03-16 DIAGNOSIS — C61 Malignant neoplasm of prostate: Secondary | ICD-10-CM | POA: Diagnosis not present

## 2023-05-23 DIAGNOSIS — Z9183 Wandering in diseases classified elsewhere: Secondary | ICD-10-CM | POA: Diagnosis not present

## 2023-05-23 DIAGNOSIS — F0283 Dementia in other diseases classified elsewhere, unspecified severity, with mood disturbance: Secondary | ICD-10-CM | POA: Diagnosis not present

## 2023-05-23 DIAGNOSIS — G301 Alzheimer's disease with late onset: Secondary | ICD-10-CM | POA: Diagnosis not present

## 2023-05-23 DIAGNOSIS — I129 Hypertensive chronic kidney disease with stage 1 through stage 4 chronic kidney disease, or unspecified chronic kidney disease: Secondary | ICD-10-CM | POA: Diagnosis not present

## 2023-06-07 DIAGNOSIS — Z85828 Personal history of other malignant neoplasm of skin: Secondary | ICD-10-CM | POA: Diagnosis not present

## 2023-06-07 DIAGNOSIS — L821 Other seborrheic keratosis: Secondary | ICD-10-CM | POA: Diagnosis not present

## 2023-06-07 DIAGNOSIS — L853 Xerosis cutis: Secondary | ICD-10-CM | POA: Diagnosis not present

## 2023-06-07 DIAGNOSIS — D692 Other nonthrombocytopenic purpura: Secondary | ICD-10-CM | POA: Diagnosis not present

## 2023-06-07 DIAGNOSIS — L570 Actinic keratosis: Secondary | ICD-10-CM | POA: Diagnosis not present

## 2023-06-12 DIAGNOSIS — I10 Essential (primary) hypertension: Secondary | ICD-10-CM | POA: Diagnosis not present

## 2023-06-26 DIAGNOSIS — I10 Essential (primary) hypertension: Secondary | ICD-10-CM | POA: Diagnosis not present

## 2023-07-13 DIAGNOSIS — I10 Essential (primary) hypertension: Secondary | ICD-10-CM | POA: Diagnosis not present

## 2023-07-26 DIAGNOSIS — I10 Essential (primary) hypertension: Secondary | ICD-10-CM | POA: Diagnosis not present

## 2023-08-10 DIAGNOSIS — K080 Exfoliation of teeth due to systemic causes: Secondary | ICD-10-CM | POA: Diagnosis not present

## 2023-08-13 DIAGNOSIS — I10 Essential (primary) hypertension: Secondary | ICD-10-CM | POA: Diagnosis not present

## 2023-08-24 DIAGNOSIS — I10 Essential (primary) hypertension: Secondary | ICD-10-CM | POA: Diagnosis not present

## 2023-08-24 DIAGNOSIS — Z79899 Other long term (current) drug therapy: Secondary | ICD-10-CM | POA: Diagnosis not present

## 2023-08-24 DIAGNOSIS — Z1331 Encounter for screening for depression: Secondary | ICD-10-CM | POA: Diagnosis not present

## 2023-08-24 DIAGNOSIS — R42 Dizziness and giddiness: Secondary | ICD-10-CM | POA: Diagnosis not present

## 2023-08-24 DIAGNOSIS — R634 Abnormal weight loss: Secondary | ICD-10-CM | POA: Diagnosis not present

## 2023-08-24 DIAGNOSIS — M542 Cervicalgia: Secondary | ICD-10-CM | POA: Diagnosis not present

## 2023-08-25 DIAGNOSIS — I10 Essential (primary) hypertension: Secondary | ICD-10-CM | POA: Diagnosis not present

## 2023-09-01 DIAGNOSIS — E785 Hyperlipidemia, unspecified: Secondary | ICD-10-CM | POA: Diagnosis not present

## 2023-09-01 DIAGNOSIS — Z9181 History of falling: Secondary | ICD-10-CM | POA: Diagnosis not present

## 2023-09-01 DIAGNOSIS — Z23 Encounter for immunization: Secondary | ICD-10-CM | POA: Diagnosis not present

## 2023-09-01 DIAGNOSIS — F5000 Anorexia nervosa, unspecified: Secondary | ICD-10-CM | POA: Diagnosis not present

## 2023-09-01 DIAGNOSIS — E441 Mild protein-calorie malnutrition: Secondary | ICD-10-CM | POA: Diagnosis not present

## 2023-09-01 DIAGNOSIS — R634 Abnormal weight loss: Secondary | ICD-10-CM | POA: Diagnosis not present

## 2023-09-01 DIAGNOSIS — R636 Underweight: Secondary | ICD-10-CM | POA: Diagnosis not present

## 2023-09-01 DIAGNOSIS — I10 Essential (primary) hypertension: Secondary | ICD-10-CM | POA: Diagnosis not present

## 2023-09-06 DIAGNOSIS — G301 Alzheimer's disease with late onset: Secondary | ICD-10-CM | POA: Diagnosis not present

## 2023-09-06 DIAGNOSIS — E441 Mild protein-calorie malnutrition: Secondary | ICD-10-CM | POA: Diagnosis not present

## 2023-09-06 DIAGNOSIS — F02A3 Dementia in other diseases classified elsewhere, mild, with mood disturbance: Secondary | ICD-10-CM | POA: Diagnosis not present

## 2023-09-06 DIAGNOSIS — F5000 Anorexia nervosa, unspecified: Secondary | ICD-10-CM | POA: Diagnosis not present

## 2023-09-24 DIAGNOSIS — I10 Essential (primary) hypertension: Secondary | ICD-10-CM | POA: Diagnosis not present

## 2023-11-22 DIAGNOSIS — H524 Presbyopia: Secondary | ICD-10-CM | POA: Diagnosis not present

## 2023-12-28 DIAGNOSIS — H401132 Primary open-angle glaucoma, bilateral, moderate stage: Secondary | ICD-10-CM | POA: Diagnosis not present

## 2023-12-28 DIAGNOSIS — H26493 Other secondary cataract, bilateral: Secondary | ICD-10-CM | POA: Diagnosis not present

## 2024-02-11 DEATH — deceased
